# Patient Record
Sex: Male | Born: 1980 | Race: White | Hispanic: No | Marital: Single | Smoking: Never smoker
Health system: Southern US, Community
[De-identification: ages and names within clinical notes are randomized; demographics above are authoritative.]

## PROBLEM LIST (undated history)

## (undated) DIAGNOSIS — Z789 Other specified health status: Secondary | ICD-10-CM

## (undated) HISTORY — PX: FRACTURE SURGERY: SHX138

---

## 2016-02-11 ENCOUNTER — Emergency Department (HOSPITAL_BASED_OUTPATIENT_CLINIC_OR_DEPARTMENT_OTHER): Payer: Worker's Compensation

## 2016-02-11 ENCOUNTER — Emergency Department (HOSPITAL_BASED_OUTPATIENT_CLINIC_OR_DEPARTMENT_OTHER)
Admission: EM | Admit: 2016-02-11 | Discharge: 2016-02-11 | Disposition: A | Discharge status: Home / Self Care | Payer: Worker's Compensation | Attending: Emergency Medicine | Admitting: Emergency Medicine

## 2016-02-11 ENCOUNTER — Encounter (HOSPITAL_BASED_OUTPATIENT_CLINIC_OR_DEPARTMENT_OTHER): Payer: Self-pay | Admitting: *Deleted

## 2016-02-11 DIAGNOSIS — S8251XA Displaced fracture of medial malleolus of right tibia, initial encounter for closed fracture: Secondary | ICD-10-CM | POA: Diagnosis not present

## 2016-02-11 DIAGNOSIS — Y939 Activity, unspecified: Secondary | ICD-10-CM | POA: Insufficient documentation

## 2016-02-11 DIAGNOSIS — Y929 Unspecified place or not applicable: Secondary | ICD-10-CM | POA: Insufficient documentation

## 2016-02-11 DIAGNOSIS — S82891A Other fracture of right lower leg, initial encounter for closed fracture: Secondary | ICD-10-CM

## 2016-02-11 DIAGNOSIS — W11XXXA Fall on and from ladder, initial encounter: Secondary | ICD-10-CM | POA: Insufficient documentation

## 2016-02-11 DIAGNOSIS — Y999 Unspecified external cause status: Secondary | ICD-10-CM | POA: Diagnosis not present

## 2016-02-11 DIAGNOSIS — M79632 Pain in left forearm: Secondary | ICD-10-CM | POA: Insufficient documentation

## 2016-02-11 DIAGNOSIS — S99911A Unspecified injury of right ankle, initial encounter: Secondary | ICD-10-CM | POA: Diagnosis present

## 2016-02-11 MED ORDER — IBUPROFEN 600 MG PO TABS: 600.0000 mg | ORAL_TABLET | Freq: Four times a day (QID) | ORAL | 0 refills | Status: DC | PRN

## 2016-02-11 MED ORDER — OXYCODONE-ACETAMINOPHEN 5-325 MG PO TABS: 1.0000 | ORAL_TABLET | ORAL | 0 refills | Status: DC | PRN

## 2016-02-11 MED ORDER — ONDANSETRON 8 MG PO TBDP
8.0000 mg | ORAL_TABLET | Freq: Once | ORAL | Status: AC
  Administered 2016-02-11: 8 mg via ORAL
  Filled 2016-02-11: qty 1

## 2016-02-11 MED ORDER — OXYCODONE-ACETAMINOPHEN 5-325 MG PO TABS
1.0000 | ORAL_TABLET | Freq: Once | ORAL | Status: AC
  Administered 2016-02-11: 1 via ORAL
  Filled 2016-02-11: qty 1

## 2016-02-11 NOTE — ED Triage Notes (Signed)
Pt o right ankle injury x 20 mins ago

## 2016-02-11 NOTE — ED Provider Notes (Signed)
[facility]   CSN: [id number] Arrival date & time: 02/11/16  1637  By signing my name below, I, Rosario AdieWilliam Andrew Hiatt, attest that this documentation has been prepared under the direction and in the presence of Kym Fenter, PA-C.  Electronically Signed: Rosario AdieWilliam Andrew Hiatt, ED Scribe. 02/11/16. 5:47 PM.  History   Chief Complaint Chief Complaint  Patient presents with  . Ankle Injury    right   The history is provided by the patient. No language interpreter was used.    HPI Comments: Shane Montes is a 35 y.o. male who presents to the Emergency Department complaining of sudden onset, gradually worsening right ankle pain w/ associated swelling onset s/p injury which occured approximately 1 hour ago. Pt reports that he was on a ladder today approximately ~7-8 feet above the ground, when the ladder fell and he braced his fall with his right foot. No LOC or head injury. He notes associated mild left forearm pain since the incident as well. Pt has not been ambulatory since the incident secondary to pain, and his pain is exacerbated with attempted movement of the ankle/toes. No noted treatments were tried prior to coming into the ED. He denies back pain, numbness, weakness, or any other associated symptoms.   History reviewed. No pertinent past medical history.  There are no active problems to display for this patient.  History reviewed. No pertinent surgical history.  Home Medications    Prior to Admission medications   Not on File    Family History History reviewed. No pertinent family history.  Social History Social History  Substance Use Topics  . Smoking status: Never Smoker  . Smokeless tobacco: Never Used  . Alcohol use Not on file   Allergies   Patient has no known allergies.  Review of Systems Review of Systems  Musculoskeletal: Positive for arthralgias (right ankle), joint swelling (right ankle) and myalgias. Negative for  back pain.  Neurological: Negative for syncope, weakness and numbness.   Physical Exam Updated Vital Signs BP 130/90   Pulse 86   Temp 98 F (36.7 C)   Resp 18   Ht 5\' 11"  (1.803 m)   Wt 200 lb (90.7 kg)   SpO2 100%   BMI 27.89 kg/m   Physical Exam  Constitutional: He appears well-developed and well-nourished. No distress.  HENT:  Head: Normocephalic and atraumatic.  Eyes: Conjunctivae are normal.  Neck: Normal range of motion.  Cardiovascular: Normal rate.   Pulmonary/Chest: Effort normal.  Abdominal: He exhibits no distension.  Musculoskeletal: Normal range of motion.  Significant swelling over right lateral malleolus. Normal medial malleolus. TTP over lateral and medial malleoli. DP pulses intact. Cap refill <2 sec to the toes. Normal sensation over the toes. Pain with any ROM at the ankle. Normal foot.  Normal knee.   Neurological: He is alert.  Skin: No pallor.  Psychiatric: He has a normal mood and affect. His behavior is normal.  Nursing note and vitals reviewed.  ED Treatments / Results  DIAGNOSTIC STUDIES: Oxygen Saturation is 100% on RA, normal by my interpretation.   COORDINATION OF CARE: 5:47 PM-Discussed next steps with pt. Pt verbalized understanding and is agreeable with the plan.   Radiology No results found.  Procedures Procedures   Medications Ordered in ED Medications - No data to display  Initial Impression / Assessment and Plan / ED Course  I have reviewed the triage vital signs and the nursing notes.  Pertinent labs & imaging results that  were available during my care of the patient were reviewed by me and considered in my medical decision making (see chart for details).  Clinical Course    Patient emergency department with right ankle injury. No other injuries during the fall. X-ray showed comminuted fracture involving medial, posterior malleoli, lateral articular surface of tibiaotalar joint. Discussed this patient with Dr. Ophelia CharterYates with  orthopedics, who recommended splints, CT of the ankle, follow-up with him in the office. Patient was giving her Percocet in emergency department. He is neurovascularly intact. He was given crutches. Instructed to keep ankle elevated above heart level, home with Percocet and ibuprofen.  Vitals:   02/11/16 1642 02/11/16 1644 02/11/16 1911  BP:  130/90 121/73  Pulse:  86 98  Resp:  18 18  Temp:  98 F (36.7 C)   SpO2:  100% 99%  Weight: 90.7 kg    Height: 5\' 11"  (1.803 m)       Final Clinical Impressions(s) / ED Diagnoses   Final diagnoses:  Closed fracture of right ankle, initial encounter   New Prescriptions Discharge Medication List as of 02/11/2016  7:12 PM    START taking these medications   Details  ibuprofen (ADVIL,MOTRIN) 600 MG tablet Take 1 tablet (600 mg total) by mouth every 6 (six) hours as needed., Starting Wed 02/11/2016, Print    oxyCODONE-acetaminophen (PERCOCET) 5-325 MG tablet Take 1 tablet by mouth every 4 (four) hours as needed for severe pain., Starting Wed 02/11/2016, Print       I personally performed the services described in this documentation, which was scribed in my presence. The recorded information has been reviewed and is accurate.     Jaynie Crumbleatyana Orlanda Lemmerman, PA-C 02/12/16 [id number]    Maia PlanJoshua G Long, MD 02/12/16 1136

## 2016-02-11 NOTE — Discharge Instructions (Signed)
It is imperative that you keep your ankle elevated above your heart level. Ice your ankle several times a day. This type of fracture does require surgery. Please call and follow up with Dr. Ophelia CharterYates in the office. Take ibuprofen for pain. Percocet for severe pain. Return if any numbness or increased pain to the foot or toes.

## 2016-02-13 ENCOUNTER — Encounter (INDEPENDENT_AMBULATORY_CARE_PROVIDER_SITE_OTHER): Payer: Self-pay | Admitting: Family

## 2016-02-13 ENCOUNTER — Other Ambulatory Visit (INDEPENDENT_AMBULATORY_CARE_PROVIDER_SITE_OTHER): Payer: Self-pay | Admitting: Family

## 2016-02-13 ENCOUNTER — Ambulatory Visit (INDEPENDENT_AMBULATORY_CARE_PROVIDER_SITE_OTHER): Payer: Worker's Compensation | Admitting: Family

## 2016-02-13 VITALS — BP 122/77 | HR 92 | Ht 72.0 in | Wt 200.0 lb

## 2016-02-13 DIAGNOSIS — S82891A Other fracture of right lower leg, initial encounter for closed fracture: Secondary | ICD-10-CM | POA: Diagnosis not present

## 2016-02-13 NOTE — Progress Notes (Deleted)
   Office Visit Note   Patient: Shane Montes           Date of Birth: 07/22/1980           MRN: [id number] Visit Date: 02/13/2016              Requested by: No referring provider defined for this encounter. PCP: No primary care provider on file.   Assessment & Plan: Visit Diagnoses:  1. Ankle fracture, right, closed, initial encounter     Plan:  Follow-Up Instructions: No Follow-up on file.   Orders:  No orders of the defined types were placed in this encounter.  No orders of the defined types were placed in this encounter.     Procedures: No procedures performed   Clinical Data: No additional findings.   Subjective: Chief Complaint  Patient presents with  . Right Ankle - Fracture    Patient presents with right ankle fracture. He is a [employer]nsic [employer] patient who fell 8 feet off of a ladder. He was seen at [facility] with x-rays and a CT scan. He is in a wheelchair and splint today.  He is taking Oxycodone, 1 every 4 hours for pain.    Review of Systems   Objective: Vital Signs: There were no vitals taken for this visit.  Physical Exam  Ortho Exam  Specialty Comments:  No specialty comments available.  Imaging: No results found.   PMFS History: There are no active problems to display for this patient.  No past medical history on file.  No family history on file.  No past surgical history on file. Social History   Occupational History  . Not on file.   Social History Main Topics  . Smoking status: Never Smoker  . Smokeless tobacco: Never Used  . Alcohol use Not on file  . Drug use: Unknown  . Sexual activity: Not on file

## 2016-02-13 NOTE — Progress Notes (Signed)
   Office Visit Note   Patient: Shane Montes           Date of Birth: 06/21/1980           MRN: [id number] Visit Date: 02/13/2016              Requested by: No referring provider defined for this encounter. PCP: No primary care provider on file.   Assessment & Plan: Visit Diagnoses:  1. Ankle fracture, right, closed, initial encounter     Plan: We will plan for open reduction internal fixation of the tibial pilon fracture on Monday with Dr. Ophelia CharterYates. Continue with elevation. leave splint in place. Percocet and ibuprofen for pain.  Follow-Up Instructions: Return in about 1 week (around 02/20/2016), or post op.   Orders:  No orders of the defined types were placed in this encounter.  No orders of the defined types were placed in this encounter.     Procedures: No procedures performed   Clinical Data: No additional findings.   Subjective: Chief Complaint  Patient presents with  . Right Ankle - Fracture    Shane Saranimothy Que is a 35 y.o. male who presents for evaluation of an ankle fracture. He fell on 02/11/16 from a ladder while at work approximately ~7-8 feet above the ground. He is in a splint today as [facility] by [facility]. He is taking Oxycodone, 1 every 4 hours for pain.This is providing good relief. He is in a wheelchair and splint today.    This is a workman's comp case.    Review of Systems  Constitutional: Negative for chills and fever.  Musculoskeletal: Positive for arthralgias and joint swelling.     Objective: Vital Signs: BP 122/77   Pulse 92   Ht 6' (1.829 m)   Wt 200 lb (90.7 kg)   BMI 27.12 kg/m   Physical Exam  Constitutional: He is oriented to person, place, and time. He appears well-developed and well-nourished.  Pulmonary/Chest: Effort normal.  Musculoskeletal:       Right ankle: He exhibits swelling.  Cap refill less than 2.  Neurological: He is alert and oriented to person, place, and time.   Skin: Skin is warm and dry.  Psychiatric: He has a normal mood and affect.  Nursing note reviewed.   Ortho Exam  Specialty Comments:  No specialty comments available.  Imaging: No results found. Radiographs as well as CT scan from the ED were independently reviewed by Dr. Ophelia CharterYates revealing the tibial pilon fracture on the right. As well as an avulsion fracture to the tip of the lateral malleolus.  PMFS History: There are no active problems to display for this patient.  History reviewed. No pertinent past medical history.  History reviewed. No pertinent family history.  History reviewed. No pertinent surgical history. Social History   Occupational History  . Not on file.   Social History Main Topics  . Smoking status: Never Smoker  . Smokeless tobacco: Never Used  . Alcohol use Yes     Comment: occasionally  . Drug use: Unknown  . Sexual activity: Not on file

## 2016-02-13 NOTE — Progress Notes (Signed)
Pt made aware to stop taking Aspirin, vitamins, fish oil and herbal medications. Do not take any NSAIDs ie: Ibuprofen, Advil, Naproxen, BC and Goody Powderor any medication containing Aspirin. Pt verbalized understanding of all pre-op instructions. 

## 2016-02-15 MED ORDER — CEFAZOLIN SODIUM-DEXTROSE 2-4 GM/100ML-% IV SOLN
2.0000 g | INTRAVENOUS | Status: AC
  Administered 2016-02-16: 2 g via INTRAVENOUS
  Filled 2016-02-15: qty 100

## 2016-02-16 ENCOUNTER — Inpatient Hospital Stay (HOSPITAL_COMMUNITY)
Admission: RE | Admit: 2016-02-16 | Discharge: 2016-02-19 | DRG: 494 | Disposition: A | Discharge status: Home / Self Care | Payer: Worker's Compensation | Source: Ambulatory Visit | Attending: Orthopaedic Surgery | Admitting: Orthopaedic Surgery

## 2016-02-16 ENCOUNTER — Encounter (HOSPITAL_COMMUNITY): Payer: Self-pay | Admitting: *Deleted

## 2016-02-16 ENCOUNTER — Inpatient Hospital Stay (HOSPITAL_COMMUNITY): Payer: Worker's Compensation | Admitting: Anesthesiology

## 2016-02-16 ENCOUNTER — Encounter (HOSPITAL_COMMUNITY)
Admission: RE | Disposition: A | Discharge status: Home / Self Care | Payer: Self-pay | Source: Ambulatory Visit | Attending: Orthopaedic Surgery

## 2016-02-16 ENCOUNTER — Inpatient Hospital Stay (HOSPITAL_COMMUNITY): Payer: Worker's Compensation

## 2016-02-16 DIAGNOSIS — S82871A Displaced pilon fracture of right tibia, initial encounter for closed fracture: Secondary | ICD-10-CM | POA: Diagnosis present

## 2016-02-16 DIAGNOSIS — M2100 Valgus deformity, not elsewhere classified, unspecified site: Secondary | ICD-10-CM | POA: Diagnosis present

## 2016-02-16 DIAGNOSIS — W11XXXA Fall on and from ladder, initial encounter: Secondary | ICD-10-CM | POA: Diagnosis present

## 2016-02-16 DIAGNOSIS — S82871D Displaced pilon fracture of right tibia, subsequent encounter for closed fracture with routine healing: Secondary | ICD-10-CM | POA: Diagnosis present

## 2016-02-16 DIAGNOSIS — Z419 Encounter for procedure for purposes other than remedying health state, unspecified: Secondary | ICD-10-CM

## 2016-02-16 HISTORY — PX: ORIF ANKLE FRACTURE: SUR919

## 2016-02-16 HISTORY — PX: EXTERNAL FIXATION LEG: SHX1549

## 2016-02-16 HISTORY — DX: Other specified health status: Z78.9

## 2016-02-16 HISTORY — PX: ORIF ANKLE FRACTURE: SHX5408

## 2016-02-16 LAB — CBC WITH DIFFERENTIAL/PLATELET
BASOS PCT: 0 %
Basophils Absolute: 0 10*3/uL (ref 0.0–0.1)
EOS ABS: 0.2 10*3/uL (ref 0.0–0.7)
EOS PCT: 3 %
HCT: 41.3 % (ref 39.0–52.0)
HEMOGLOBIN: 14.4 g/dL (ref 13.0–17.0)
LYMPHS ABS: 0.8 10*3/uL (ref 0.7–4.0)
Lymphocytes Relative: 10 %
MCH: 30.1 pg (ref 26.0–34.0)
MCHC: 34.9 g/dL (ref 30.0–36.0)
MCV: 86.4 fL (ref 78.0–100.0)
Monocytes Absolute: 0.8 10*3/uL (ref 0.1–1.0)
Monocytes Relative: 11 %
NEUTROS PCT: 76 %
Neutro Abs: 5.8 10*3/uL (ref 1.7–7.7)
PLATELETS: 220 10*3/uL (ref 150–400)
RBC: 4.78 MIL/uL (ref 4.22–5.81)
RDW: 12.1 % (ref 11.5–15.5)
WBC: 7.7 10*3/uL (ref 4.0–10.5)

## 2016-02-16 LAB — COMPREHENSIVE METABOLIC PANEL
ALBUMIN: 4.1 g/dL (ref 3.5–5.0)
ALK PHOS: 46 U/L (ref 38–126)
ALT: 21 U/L (ref 17–63)
ANION GAP: 10 (ref 5–15)
AST: 24 U/L (ref 15–41)
BUN: 10 mg/dL (ref 6–20)
CALCIUM: 9.3 mg/dL (ref 8.9–10.3)
CHLORIDE: 104 mmol/L (ref 101–111)
CO2: 25 mmol/L (ref 22–32)
Creatinine, Ser: 0.89 mg/dL (ref 0.61–1.24)
GFR calc non Af Amer: >60 mL/min (ref >60)
GLUCOSE: 103 mg/dL — AB (ref 65–99)
Potassium: 3.7 mmol/L (ref 3.5–5.1)
SODIUM: 139 mmol/L (ref 135–145)
TOTAL PROTEIN: 7.8 g/dL (ref 6.5–8.1)
Total Bilirubin: 1.2 mg/dL (ref 0.3–1.2)

## 2016-02-16 LAB — NO BLOOD PRODUCTS

## 2016-02-16 SURGERY — OPEN REDUCTION INTERNAL FIXATION (ORIF) ANKLE FRACTURE
Anesthesia: Regional | Site: Ankle | Laterality: Right

## 2016-02-16 MED ORDER — DOCUSATE SODIUM 100 MG PO CAPS
100.0000 mg | ORAL_CAPSULE | Freq: Two times a day (BID) | ORAL | Status: DC
  Administered 2016-02-16 – 2016-02-19 (×6): 100 mg via ORAL
  Filled 2016-02-16 (×6): qty 1

## 2016-02-16 MED ORDER — LIDOCAINE HCL (CARDIAC) 20 MG/ML IV SOLN
INTRAVENOUS | Status: DC | PRN
  Administered 2016-02-16: 100 mg via INTRAVENOUS

## 2016-02-16 MED ORDER — ACETAMINOPHEN 325 MG PO TABS
650.0000 mg | ORAL_TABLET | Freq: Four times a day (QID) | ORAL | Status: DC | PRN
  Administered 2016-02-18 – 2016-02-19 (×3): 650 mg via ORAL
  Filled 2016-02-16 (×3): qty 2

## 2016-02-16 MED ORDER — ONDANSETRON HCL 4 MG/2ML IJ SOLN: 4.0000 mg | Freq: Four times a day (QID) | INTRAMUSCULAR | Status: DC | PRN

## 2016-02-16 MED ORDER — CHLORHEXIDINE GLUCONATE 4 % EX LIQD: 60.0000 mL | Freq: Once | CUTANEOUS | Status: DC

## 2016-02-16 MED ORDER — 0.9 % SODIUM CHLORIDE (POUR BTL) OPTIME
TOPICAL | Status: DC | PRN
  Administered 2016-02-16: 1000 mL

## 2016-02-16 MED ORDER — PROPOFOL 10 MG/ML IV BOLUS
INTRAVENOUS | Status: AC
  Filled 2016-02-16: qty 40

## 2016-02-16 MED ORDER — OXYCODONE HCL 5 MG/5ML PO SOLN: 5.0000 mg | Freq: Once | ORAL | Status: DC | PRN

## 2016-02-16 MED ORDER — MIDAZOLAM HCL 2 MG/2ML IJ SOLN
2.0000 mg | Freq: Once | INTRAMUSCULAR | Status: AC
  Administered 2016-02-16: 2 mg via INTRAVENOUS

## 2016-02-16 MED ORDER — FENTANYL CITRATE (PF) 100 MCG/2ML IJ SOLN
INTRAMUSCULAR | Status: AC
  Administered 2016-02-16: 100 ug via INTRAVENOUS
  Filled 2016-02-16: qty 2

## 2016-02-16 MED ORDER — OXYCODONE-ACETAMINOPHEN 7.5-325 MG PO TABS
1.0000 | ORAL_TABLET | Freq: Four times a day (QID) | ORAL | Status: DC | PRN
  Administered 2016-02-17 (×3): 2 via ORAL
  Administered 2016-02-17: 1 via ORAL
  Administered 2016-02-18 – 2016-02-19 (×5): 2 via ORAL
  Filled 2016-02-16 (×5): qty 2
  Filled 2016-02-16: qty 1
  Filled 2016-02-16 (×4): qty 2

## 2016-02-16 MED ORDER — PROPOFOL 10 MG/ML IV BOLUS
INTRAVENOUS | Status: DC | PRN
  Administered 2016-02-16: 180 mg via INTRAVENOUS

## 2016-02-16 MED ORDER — HYDROMORPHONE HCL 2 MG/ML IJ SOLN
1.0000 mg | INTRAMUSCULAR | Status: DC | PRN
  Administered 2016-02-17 – 2016-02-19 (×5): 1 mg via INTRAVENOUS
  Filled 2016-02-16 (×5): qty 1

## 2016-02-16 MED ORDER — FENTANYL CITRATE (PF) 100 MCG/2ML IJ SOLN
INTRAMUSCULAR | Status: AC
  Filled 2016-02-16: qty 2

## 2016-02-16 MED ORDER — MIDAZOLAM HCL 2 MG/2ML IJ SOLN
INTRAMUSCULAR | Status: AC
  Administered 2016-02-16: 2 mg via INTRAVENOUS
  Filled 2016-02-16: qty 2

## 2016-02-16 MED ORDER — LACTATED RINGERS IV SOLN
INTRAVENOUS | Status: DC | PRN
  Administered 2016-02-16 (×2): via INTRAVENOUS

## 2016-02-16 MED ORDER — SODIUM CHLORIDE 0.9 % IV SOLN
INTRAVENOUS | Status: DC
  Administered 2016-02-16: 22:00:00 via INTRAVENOUS

## 2016-02-16 MED ORDER — MIDAZOLAM HCL 2 MG/2ML IJ SOLN
INTRAMUSCULAR | Status: AC
  Filled 2016-02-16: qty 2

## 2016-02-16 MED ORDER — ONDANSETRON HCL 4 MG/2ML IJ SOLN
INTRAMUSCULAR | Status: DC | PRN
  Administered 2016-02-16: 4 mg via INTRAVENOUS

## 2016-02-16 MED ORDER — ROPIVACAINE HCL 7.5 MG/ML IJ SOLN
INTRAMUSCULAR | Status: DC | PRN
  Administered 2016-02-16: 5 mL via PERINEURAL
  Administered 2016-02-16: 10 mL via PERINEURAL

## 2016-02-16 MED ORDER — CEFAZOLIN IN D5W 1 GM/50ML IV SOLN
1.0000 g | Freq: Three times a day (TID) | INTRAVENOUS | Status: AC
  Administered 2016-02-16 – 2016-02-17 (×3): 1 g via INTRAVENOUS
  Filled 2016-02-16 (×3): qty 50

## 2016-02-16 MED ORDER — METOCLOPRAMIDE HCL 5 MG PO TABS: 5.0000 mg | ORAL_TABLET | Freq: Three times a day (TID) | ORAL | Status: DC | PRN

## 2016-02-16 MED ORDER — METHOCARBAMOL 500 MG PO TABS
500.0000 mg | ORAL_TABLET | Freq: Four times a day (QID) | ORAL | Status: DC | PRN
  Administered 2016-02-17 – 2016-02-19 (×8): 500 mg via ORAL
  Filled 2016-02-16 (×9): qty 1

## 2016-02-16 MED ORDER — FENTANYL CITRATE (PF) 100 MCG/2ML IJ SOLN
100.0000 ug | Freq: Once | INTRAMUSCULAR | Status: AC
  Administered 2016-02-16: 100 ug via INTRAVENOUS

## 2016-02-16 MED ORDER — PHENYLEPHRINE HCL 10 MG/ML IJ SOLN
INTRAMUSCULAR | Status: DC | PRN
Start: 2016-02-16 — End: 2016-02-16
  Administered 2016-02-16: 80 ug via INTRAVENOUS

## 2016-02-16 MED ORDER — MIDAZOLAM HCL 5 MG/5ML IJ SOLN
INTRAMUSCULAR | Status: DC | PRN
  Administered 2016-02-16: 0.5 mg via INTRAVENOUS

## 2016-02-16 MED ORDER — METHOCARBAMOL 1000 MG/10ML IJ SOLN
500.0000 mg | Freq: Four times a day (QID) | INTRAVENOUS | Status: DC | PRN
  Filled 2016-02-16: qty 5

## 2016-02-16 MED ORDER — HYDROMORPHONE HCL 1 MG/ML IJ SOLN: 0.2500 mg | INTRAMUSCULAR | Status: DC | PRN

## 2016-02-16 MED ORDER — FENTANYL CITRATE (PF) 100 MCG/2ML IJ SOLN
INTRAMUSCULAR | Status: DC | PRN
  Administered 2016-02-16: 50 ug via INTRAVENOUS
  Administered 2016-02-16 (×2): 25 ug via INTRAVENOUS

## 2016-02-16 MED ORDER — BUPIVACAINE-EPINEPHRINE (PF) 0.5% -1:200000 IJ SOLN
INTRAMUSCULAR | Status: DC | PRN
  Administered 2016-02-16: 20 mL via PERINEURAL
  Administered 2016-02-16: 10 mL via PERINEURAL

## 2016-02-16 MED ORDER — ONDANSETRON HCL 4 MG PO TABS: 4.0000 mg | ORAL_TABLET | Freq: Four times a day (QID) | ORAL | Status: DC | PRN

## 2016-02-16 MED ORDER — OXYCODONE HCL 5 MG PO TABS: 5.0000 mg | ORAL_TABLET | Freq: Once | ORAL | Status: DC | PRN

## 2016-02-16 MED ORDER — POLYETHYLENE GLYCOL 3350 17 G PO PACK
17.0000 g | PACK | Freq: Every day | ORAL | Status: DC | PRN
  Filled 2016-02-16: qty 1

## 2016-02-16 MED ORDER — METOCLOPRAMIDE HCL 5 MG/ML IJ SOLN: 5.0000 mg | Freq: Three times a day (TID) | INTRAMUSCULAR | Status: DC | PRN

## 2016-02-16 MED ORDER — ACETAMINOPHEN 650 MG RE SUPP: 650.0000 mg | Freq: Four times a day (QID) | RECTAL | Status: DC | PRN

## 2016-02-16 SURGICAL SUPPLY — 74 items
BANDAGE ACE 4X5 VEL STRL LF (GAUZE/BANDAGES/DRESSINGS) ×6 IMPLANT
BANDAGE ACE 6X5 VEL STRL LF (GAUZE/BANDAGES/DRESSINGS) IMPLANT
BANDAGE ESMARK 6X9 LF (GAUZE/BANDAGES/DRESSINGS) ×1 IMPLANT
BAR GLASS FIBER EXFX 11X400 (EXFIX) ×6 IMPLANT
BIT DRILL 2.5X2.75 QC CALB (BIT) ×3 IMPLANT
BIT DRILL 2.9 CANN QC NONSTRL (BIT) ×3 IMPLANT
BIT DRILL 3.5X5.5 QC CALB (BIT) ×3 IMPLANT
BNDG ESMARK 6X9 LF (GAUZE/BANDAGES/DRESSINGS) ×3
BNDG GAUZE ELAST 4 BULKY (GAUZE/BANDAGES/DRESSINGS) ×6 IMPLANT
CATH ROBINSON RED A/P 14FR (CATHETERS) ×3 IMPLANT
CLAMP BLUE BAR TO PIN (EXFIX) ×6 IMPLANT
COVER MAYO STAND STRL (DRAPES) ×3 IMPLANT
COVER SURGICAL LIGHT HANDLE (MISCELLANEOUS) ×3 IMPLANT
CUFF TOURNIQUET SINGLE 34IN LL (TOURNIQUET CUFF) ×3 IMPLANT
CUFF TOURNIQUET SINGLE 44IN (TOURNIQUET CUFF) IMPLANT
DRAPE C-ARM 42X72 X-RAY (DRAPES) ×3 IMPLANT
DRAPE C-ARMOR (DRAPES) ×3 IMPLANT
DRAPE INCISE IOBAN 66X45 STRL (DRAPES) IMPLANT
DRAPE PROXIMA HALF (DRAPES) ×3 IMPLANT
DRAPE U-SHAPE 47X51 STRL (DRAPES) ×3 IMPLANT
DRSG PAD ABDOMINAL 8X10 ST (GAUZE/BANDAGES/DRESSINGS) ×9 IMPLANT
DURAPREP 26ML APPLICATOR (WOUND CARE) ×3 IMPLANT
ELECT REM PT RETURN 9FT ADLT (ELECTROSURGICAL) ×3
ELECTRODE REM PT RTRN 9FT ADLT (ELECTROSURGICAL) ×1 IMPLANT
GAUZE SPONGE 4X4 12PLY STRL (GAUZE/BANDAGES/DRESSINGS) ×3 IMPLANT
GAUZE XEROFORM 5X9 LF (GAUZE/BANDAGES/DRESSINGS) ×3 IMPLANT
GLOVE BIOGEL PI IND STRL 8 (GLOVE) ×2 IMPLANT
GLOVE BIOGEL PI INDICATOR 8 (GLOVE) ×4
GLOVE ORTHO TXT STRL SZ7.5 (GLOVE) ×6 IMPLANT
GOWN STRL REUS W/ TWL LRG LVL3 (GOWN DISPOSABLE) ×1 IMPLANT
GOWN STRL REUS W/ TWL XL LVL3 (GOWN DISPOSABLE) ×1 IMPLANT
GOWN STRL REUS W/TWL 2XL LVL3 (GOWN DISPOSABLE) ×3 IMPLANT
GOWN STRL REUS W/TWL LRG LVL3 (GOWN DISPOSABLE) ×2
GOWN STRL REUS W/TWL XL LVL3 (GOWN DISPOSABLE) ×2
K-WIRE ACE 1.6X6 (WIRE) ×9
KIT BASIN OR (CUSTOM PROCEDURE TRAY) ×3 IMPLANT
KIT ROOM TURNOVER OR (KITS) ×3 IMPLANT
KWIRE ACE 1.6X6 (WIRE) ×3 IMPLANT
MANIFOLD NEPTUNE II (INSTRUMENTS) ×3 IMPLANT
NS IRRIG 1000ML POUR BTL (IV SOLUTION) ×3 IMPLANT
PACK ORTHO EXTREMITY (CUSTOM PROCEDURE TRAY) ×3 IMPLANT
PAD ARMBOARD 7.5X6 YLW CONV (MISCELLANEOUS) ×6 IMPLANT
PAD CAST 4YDX4 CTTN HI CHSV (CAST SUPPLIES) ×2 IMPLANT
PADDING CAST COTTON 4X4 STRL (CAST SUPPLIES) ×4
PADDING CAST COTTON 6X4 STRL (CAST SUPPLIES) ×3 IMPLANT
PIN CLAMP 2BAR 75MM BLUE (EXFIX) ×3 IMPLANT
PIN HALF YELLOW 5X160X35 (EXFIX) ×6 IMPLANT
PIN TRANSFIXING 5.0 (EXFIX) ×3 IMPLANT
PLATE 6H RT DIST ANTLAT TIB (Plate) ×2 IMPLANT
PLATE ANTLAT CNTR NAR 114X6 (Plate) ×1 IMPLANT
SCREW 3.5MM CORT LP 34MM (Screw) ×3 IMPLANT
SCREW ACE CAN 4.0 40M (Screw) ×6 IMPLANT
SCREW CORT T15 30X3.5XST LCK (Screw) ×1 IMPLANT
SCREW CORTICAL 3.5MM  30MM (Screw) ×2 IMPLANT
SCREW CORTICAL 3.5MM 30MM (Screw) ×1 IMPLANT
SCREW CORTICAL 3.5MM 40MM (Screw) ×3 IMPLANT
SCREW CORTICAL 3.5MM 48MM (Screw) ×6 IMPLANT
SCREW CORTICAL 3.5X30MM (Screw) ×2 IMPLANT
SCREW LOCK CORT STAR 3.5X48 (Screw) ×3 IMPLANT
SCREW LP 3.5 (Screw) ×3 IMPLANT
SPONGE LAP 18X18 X RAY DECT (DISPOSABLE) ×3 IMPLANT
STAPLER VISISTAT 35W (STAPLE) IMPLANT
SUCTION FRAZIER HANDLE 10FR (MISCELLANEOUS) ×2
SUCTION TUBE FRAZIER 10FR DISP (MISCELLANEOUS) ×1 IMPLANT
SUT ETHILON 2 0 PSLX (SUTURE) ×6 IMPLANT
SUT ETHILON 3 0 PS 1 (SUTURE) ×6 IMPLANT
SUT VIC AB 2-0 CT1 27 (SUTURE) ×10
SUT VIC AB 2-0 CT1 TAPERPNT 27 (SUTURE) ×5 IMPLANT
TOWEL OR 17X24 6PK STRL BLUE (TOWEL DISPOSABLE) ×3 IMPLANT
TOWEL OR 17X26 10 PK STRL BLUE (TOWEL DISPOSABLE) ×3 IMPLANT
TUBE CONNECTING 12'X1/4 (SUCTIONS) ×1
TUBE CONNECTING 12X1/4 (SUCTIONS) ×2 IMPLANT
WATER STERILE IRR 1000ML POUR (IV SOLUTION) IMPLANT
YANKAUER SUCT BULB TIP NO VENT (SUCTIONS) ×3 IMPLANT

## 2016-02-16 NOTE — Interval H&P Note (Signed)
History and Physical Interval Note:  02/16/2016 2:25 PM  Shane Montes  has presented today for surgery, with the diagnosis of Right Distal Tibia Pilon Fracture  The various methods of treatment have been discussed with the patient and family. After consideration of risks, benefits and other options for treatment, the patient has consented to  Procedure(s): OPEN REDUCTION INTERNAL FIXATION (ORIF) RIGHT DISTAL TIBIA PILON FRACTURE (Right) as a surgical intervention .  The patient's history has been reviewed, patient examined, no change in status, stable for surgery.  I have reviewed the patient's chart and labs.  Questions were answered to the patient's satisfaction.     Eldred MangesMark C Rosalyn Archambault

## 2016-02-16 NOTE — Anesthesia Procedure Notes (Signed)
Anesthesia Regional Block:  Popliteal block  Pre-Anesthetic Checklist: ,, timeout performed, Correct Patient, Correct Site, Correct Laterality, Correct Procedure, Correct Position, site marked, Risks and benefits discussed,  Surgical consent,  Pre-op evaluation,  At surgeon's request and post-op pain management  Laterality: Lower and Right  Prep: chloraprep       Needles:  Injection technique: Single-shot  Needle Type: Echogenic Stimulator Needle          Additional Needles:  Procedures: ultrasound guided (picture in chart) Popliteal block Narrative:  Start time: 02/16/2016 2:45 PM End time: 02/16/2016 2:53 PM Injection made incrementally with aspirations every 5 mL.  Performed by: Personally  Anesthesiologist: Jacqulyne Gladue  Additional Notes: H+P and labs reviewed, risks and benefits discussed with patient, procedure tolerated well without complications

## 2016-02-16 NOTE — Anesthesia Preprocedure Evaluation (Addendum)
Anesthesia Evaluation  Patient identified by MRN, date of birth, ID band Patient awake    Reviewed: Allergy & Precautions, NPO status , Patient's Chart, lab work & pertinent test results  History of Anesthesia Complications Negative for: history of anesthetic complications  Airway Mallampati: I  TM Distance: >3 FB Neck ROM: Full    Dental  (+) Teeth Intact, Dental Advisory Given   Pulmonary neg pulmonary ROS,    breath sounds clear to auscultation       Cardiovascular negative cardio ROS   Rhythm:Regular     Neuro/Psych negative neurological ROS  negative psych ROS   GI/Hepatic negative GI ROS, Neg liver ROS,   Endo/Other  negative endocrine ROS  Renal/GU negative Renal ROS     Musculoskeletal   Abdominal   Peds  Hematology negative hematology ROS (+)   Anesthesia Other Findings Je[employer]  Reproductive/Obstetrics                            Anesthesia Physical Anesthesia Plan  ASA: I  Anesthesia Plan: General and Regional   Post-op Pain Management:    Induction: Intravenous  Airway Management Planned: LMA and Oral ETT  Additional Equipment: None  Intra-op Plan:   Post-operative Plan: Extubation in OR  Informed Consent: I have reviewed the patients History and Physical, chart, labs and discussed the procedure including the risks, benefits and alternatives for the proposed anesthesia with the patient or authorized representative who has indicated his/her understanding and acceptance.   Dental advisory given  Plan Discussed with: CRNA and Surgeon  Anesthesia Plan Comments:        Anesthesia Quick Evaluation

## 2016-02-16 NOTE — Anesthesia Procedure Notes (Signed)
Procedure Name: LMA Insertion Date/Time: 02/16/2016 3:38 PM Performed by: Darcey NoraJAMES, Jamella Grayer B Pre-anesthesia Checklist: Patient identified, Emergency Drugs available, Suction available and Patient being monitored Patient Re-evaluated:Patient Re-evaluated prior to inductionOxygen Delivery Method: Circle system utilized Preoxygenation: Pre-oxygenation with 100% oxygen Intubation Type: IV induction Ventilation: Mask ventilation without difficulty LMA: LMA inserted LMA Size: 5.0 Number of attempts: 1 Placement Confirmation: positive ETCO2 and breath sounds checked- equal and bilateral Tube secured with: Tape (taped across cheeks) Dental Injury: Teeth and Oropharynx as per pre-operative assessment

## 2016-02-16 NOTE — Transfer of Care (Signed)
Immediate Anesthesia Transfer of Care Note  Patient: Shane Montes  Procedure(s) Performed: Procedure(s): OPEN REDUCTION INTERNAL FIXATION (ORIF) RIGHT DISTAL TIBIA PILON FRACTURE (Right) EXTERNAL FIXATION RIGHT DISTAL TIBIA FOR REDUCTION AND LENGTH ACQUSITION OF PILON FRACTURE FOR PLATE FIXATAION OF FRACTURE (Right)  Patient Location: PACU  Anesthesia Type:GA combined with regional for post-op pain  Level of Consciousness: awake, oriented and patient cooperative  Airway & Oxygen Therapy: Patient Spontanous Breathing and Patient connected to nasal cannula oxygen  Post-op Assessment: Report given to RN, Post -op Vital signs reviewed and stable and Patient moving all extremities  Post vital signs: Reviewed and stable  Last Vitals:  Vitals:   02/16/16 1500 02/16/16 1501  BP:    Pulse: 90 97  Resp:    Temp:      Last Pain:  Vitals:   02/16/16 1258  TempSrc: Oral         Complications: No apparent anesthesia complications

## 2016-02-16 NOTE — Anesthesia Procedure Notes (Signed)
Anesthesia Regional Block:  Adductor canal block  Pre-Anesthetic Checklist: ,, timeout performed, Correct Patient, Correct Site, Correct Laterality, Correct Procedure, Correct Position, site marked, Risks and benefits discussed,  Surgical consent,  Pre-op evaluation,  At surgeon's request and post-op pain management  Laterality: Lower and Right  Prep: chloraprep       Needles:  Injection technique: Single-shot  Needle Type: Echogenic Stimulator Needle          Additional Needles:  Procedures: ultrasound guided (picture in chart) Adductor canal block Narrative:  Start time: 02/16/2016 2:45 PM End time: 02/16/2016 2:53 PM Injection made incrementally with aspirations every 5 mL.  Performed by: Personally  Anesthesiologist: Margrete Delude  Additional Notes: H+P and labs reviewed, risks and benefits discussed with patient, procedure tolerated well without complications

## 2016-02-16 NOTE — H&P (Signed)
 [] Hover for attribution information   Office Visit Note/  History and Physical Exam              Patient: Shane Montes                                 Date of Birth: 09/22/1980                                                     MRN: [id number] Visit Date: 02/13/2016                                                                     Requested by: No referring provider defined for this encounter. PCP: No primary care provider on file.   Assessment & Plan: Visit Diagnoses:  1. Ankle fracture, right, closed, initial encounter     Plan: We will plan for open reduction internal fixation of the tibial pilon fracture on Monday with Dr. Ophelia CharterYates. Continue with elevation. leave splint in place. Percocet and ibuprofen for pain.  Follow-Up Instructions: Return in about 1 week (around 02/20/2016), or post op.   Orders:  No orders of the defined types were placed in this encounter.  No orders of the defined types were placed in this encounter.     Procedures: No procedures performed   Clinical Data: No additional findings.   Subjective:    Chief Complaint  Patient presents with  . Right Ankle - Fracture    Shane Montes is a 35 y.o. male who presents for evaluation of an ankle fracture. He fell on 02/11/16 from a ladder while at work approximately ~7-8 feet above the ground. He is in a splint today as [facility] by [facility]. He is taking Oxycodone, 1 every 4 hours for pain.This is providing good relief. He is in a wheelchair and splint today.    This is a workman's comp case.   Review of Systems  Constitutional: Negative for chills and fever.  Musculoskeletal: Positive for arthralgias and joint swelling.     Objective: Vital Signs: BP 122/77   Pulse 92   Ht 6' (1.829 m)   Wt 200 lb (90.7 kg)   BMI 27.12 kg/m   Physical Exam  Constitutional: He is oriented to person, place, and time. He appears  well-developed and well-nourished.  Pulmonary/Chest: Effort normal.  Musculoskeletal:       Right ankle: He exhibits swelling.  Cap refill less than 2.  Neurological: He is alert and oriented to person, place, and time.  Skin: Skin is warm and dry.  Psychiatric: He has a normal mood and affect.  Nursing note reviewed.   Ortho Exam  Specialty Comments:  No specialty comments available.  Imaging: No results found. Radiographs as well as CT scan from the ED were independently reviewed by Dr. Ophelia CharterYates revealing the tibial pilon fracture on the right. As well as an avulsion fracture to the tip of the lateral malleolus.  PMFS History: There are no active problems to display  for this patient.  History reviewed. No pertinent past medical history.  History reviewed. No pertinent family history.  History reviewed. No pertinent surgical history. Social History      Occupational History  . Not on file.           Social History Main Topics    . Smoking status: Never Smoker   . Smokeless tobacco: Never Used   . Alcohol use Yes      Comment: occasionally    . Drug use: Unknown  . Sexual activity: Not on file           Electronically signed by Berton LanErin Renee Zamora, NP at 02/13/2016 4:33 PM      Office Visit on 02/13/2016        Detailed Report

## 2016-02-17 ENCOUNTER — Encounter (HOSPITAL_COMMUNITY): Payer: Self-pay | Admitting: Orthopaedic Surgery

## 2016-02-17 LAB — BASIC METABOLIC PANEL
Anion gap: 10 (ref 5–15)
BUN: 7 mg/dL (ref 6–20)
CALCIUM: 8.6 mg/dL — AB (ref 8.9–10.3)
CO2: 26 mmol/L (ref 22–32)
CREATININE: 0.89 mg/dL (ref 0.61–1.24)
Chloride: 101 mmol/L (ref 101–111)
Glucose, Bld: 120 mg/dL — ABNORMAL HIGH (ref 65–99)
Potassium: 3.8 mmol/L (ref 3.5–5.1)
SODIUM: 137 mmol/L (ref 135–145)

## 2016-02-17 NOTE — Brief Op Note (Signed)
02/16/2016  7:49 AM  PATIENT:  Joenathan Defina  35 y.o. male  PRE-OPERATIVE DIAGNOSIS:  Right Distal Tibia Pilon Fracture  POST-OPERATIVE DIAGNOSIS:  Right Distal Tibia Pilon Fracture  PROCEDURE:  Procedure(s): OPEN REDUCTION INTERNAL FIXATION (ORIF) RIGHT DISTAL TIBIA PILON FRACTURE (Right) EXTERNAL FIXATION RIGHT DISTAL TIBIA FOR REDUCTION AND LENGTH ACQUSITION OF PILON FRACTURE FOR PLATE FIXATAION OF FRACTURE (Right)  SURGEON:  Surgeon(s) and Role: Panel 1:     Eldred MangesMark C Yates, MD   PHYSICIAN ASSISTANT: Briarrose Shor pa-c    ANESTHESIA:   general  EBL:  No intake/output data recorded.  BLOOD ADMINISTERED:none  DRAINS: none   LOCAL MEDICATIONS USED:  NONE  SPECIMEN:  No Specimen  DISPOSITION OF SPECIMEN:  N/A  COUNTS:  YES  TOURNIQUET:   Total Tourniquet Time Documented: Thigh (Right) - 66 minutes Total: Thigh (Right) - 66 minutes   DICTATION: .Reubin Milanragon Dictation  PLAN OF CARE: Admit to inpatient   PATIENT DISPOSITION:  PACU - hemodynamically stable.

## 2016-02-17 NOTE — Op Note (Signed)
NAME:  Shane Montes, Shane Montes         ACCOUNT NO.:  [id number]  MEDICAL RECORD NO.:  [id number]  LOCATION:                                 FACILITY:  PHYSICIAN:  Philipp Callegari C. Ophelia CharterYates, M.D.    DATE OF BIRTH:  March 17, 1980  DATE OF PROCEDURE:  02/16/2016 DATE OF DISCHARGE:                              OPERATIVE REPORT   PREOPERATIVE DIAGNOSIS:  Right severely comminuted pilon fracture (fracture of distal tibia articular surface).  POSTOPERATIVE DIAGNOSIS:  Right severely comminuted pilon fracture (fracture of distal tibia articular surface).  PROCEDURE:  Application of external fixator, open reduction and internal fixation, anterolateral compression plate fixation.  SURGEON:  Indra Wolters C. Ophelia CharterYates, M.D.  ASSISTANT:  Genene ChurnJames M. Barry Dieneswens, PA-C, medically necessary and present for the entire procedure.  ANESTHESIA:  General.  DESCRIPTION OF PROCEDURE:  After induction of general anesthesia, preoperative Ancef prophylaxis, clipping of the hair anteriorly, there was only mild swelling.  The patient had been compliant, kept his leg elevated the last 3 days.  After time-out procedure, standard prepping and draping with DuraPrep, external fixator was applied using C-arm visualization.  Two bicortical pins were placed proximally far enough above the length of the short Biomet anterolateral plate so that they would not be close to the incision.  40-mm rods were used and transcalcaneal pin was placed and then traction was applied straightening out the valgus deformity of the distal tibia articular surface.  The patient had blown the distal tibia into multiple pieces with extreme comminution of the articular surface but the posterior malleolus was a large piece, the medial malleolus was fractured but was in relatively good position and there was posterolateral comminution. Once the fixator was taken out to length, a sterile skin marker was used to outline the planned incision and an anterolateral exposure  was performed.  The superficial peroneal vessels were mobilized.  The extensors were taken toward the midline.  Subperiosteal dissection on the anterior surface of the tibia was performed until the fracture could be reduced.  Using 2 sets of [facility] tongs, a stab incision was made posteriorly adjacent to the Achilles tendon and posterior to the peroneal tendons.  Blunt dissection was performed with a tonsil clamp down to the posterior cortex, and using combination of two [employer] tongs pulling from posterior closing the posterior malleolar fragment which had to be adjusted several times until finally it was clamped low enough to pull it in good position.  A stab incision was made anteromedial making sure that it was medial neurovascular bundle and not far enough medial to catch the saphenous vein and this was tightened down securely.  K-wires were placed, and then lag screws were used over drilling the proximal cortex 3.5 and then catching the posterior cortex. Screws were then placed from lateral to medial identically with the other tong tightened down holding the lateral cortex of the distal tibia to the medial cortex which closed down the fracture site.  This was under direct visualization and 2 screws were placed.  Anterolateral plate then had the self-retaining screw.  Guides were removed from the distal aspect.  It was just placed on the tibia, held with a K-wire just it is needed, and then  individually the purple guide screw reamers were placed back onto the plate and distal most articular surface screws were placed lagging from anterior to posterior securing the posterior malleolar fragment that had been pulled down the [facility] tongs and had been fixed with a screw from proximal anteromedial to posterolateral. AP and lateral fluoroscopy and mortise views were used for rotation and checked to make sure all screws were good.  Two 40-mm malleolar screws were then placed, one  screw was close to the joint and did not enter the joint.  Irrigation was performed and proximally 2 screws have been placed nonlocking to suck the plate down to the bone as well as distal one of the screws nonlocking to suck the plate down to the bone, four locking screws were placed.  Articular surface was in satisfactory position.  Irrigation and then closure of the retinaculum then opened with 2-0 Vicryl.  A 2-0 nylon far-near and near-far skin suture, Xeroform.  The external fixator was relaxed, checked under fluoro to make sure the joint surface still looked good.  With the ankle held in good position, the external fixator was then tightened back down securely.  Since there was severe comminution of the joint but the joint was in good position, the external fixator was left on for additional stability.  The patient will be nonweightbearing.  Xeroform and 4x4s were used for stab incisions.  For the [facility] tong application posterolateral was closed with stapler, stab incision for the anteromedial [employer] tong was closed with nylon and some with staples. Compartments were soft.  Xeroform, 4x4s, ABDs, and Ace wraps were applied.  The patient tolerated the procedure well and was transferred to recovery room in stable condition.     Lillee Mooneyhan C. Ophelia CharterYates, M.D.   ______________________________ Veverly FellsMark C. Ophelia CharterYates, M.D.    MCY/MEDQ  D:  02/16/2016  T:  02/17/2016  Job:  [id number]

## 2016-02-17 NOTE — Evaluation (Signed)
Physical Therapy Evaluation Patient Details Name: Shane Montes MRN: [id number] DOB: 11/23/1980 Today's Date: 02/17/2016   History of Present Illness  Admitted after fall from ladder resulting in R tibial pilon fx; now s/p ORIF and External Fixator placement; NWB;  has a past medical history of Medical history non-contributory.   Clinical Impression   Patient is s/p above surgery resulting in functional limitations due to the deficits listed below (see PT Problem List). Overall moving well, though painful; Good maintenance of NWB; Worked today with RW for more stability first time up post-op, but anticipate good progression to crutches;  Patient will benefit from skilled PT to increase their independence and safety with mobility to allow discharge to the venue listed below.       Follow Up Recommendations Home health PT;Other (comment) (May progress well enough to not need HHPT) The potential need for Outpatient PT can be addressed at Ortho follow-up appointments.    Production managerquipment Recommendations  Wheelchair with elevating legrest; Other (comment) (Cosnider RW and 3in1, but anticipate that crutches will meet pt's needs)    Recommendations for Other Services       Precautions / Restrictions Precautions Precaution Comments: Ex-Fix Restrictions RLE Weight Bearing: Non weight bearing      Mobility  Bed Mobility Overal bed mobility: Needs Assistance Bed Mobility: Supine to Sit     Supine to sit: Supervision     General bed mobility comments: Cues for technqiue; Supervision mainly for lines  Transfers Overall transfer level: Needs assistance Equipment used: Rolling walker (2 wheeled) Transfers: Sit to/from Stand Sit to Stand: Min guard         General transfer comment: Minguard for safety; cues for hand placement and control  Ambulation/Gait Ambulation/Gait assistance: Min guard Ambulation Distance (Feet): 20 Feet (to and from bathroom) Assistive device: Rolling walker  (2 wheeled) Gait Pattern/deviations:  (hop-to pattern)     General Gait Details: Tim demonstrated very good support of body weight on RW; good job maintaining NWB  Information systems managertairs            Wheelchair Mobility    Modified Rankin (Stroke Patients Only)       Balance                                             Pertinent Vitals/Pain Pain Assessment: 0-10 Pain Score: 7  Pain Location: R LE; describes being able to feel pins, and is painful Pain Descriptors / Indicators: Aching Pain Intervention(s): Monitored during session;Repositioned;Ice applied    Home Living Family/patient expects to be discharged to:: Private residence Living Arrangements: Alone (plans to go to mother's home) Available Help at Discharge: Family;Available PRN/intermittently Type of Home: House Home Access: Stairs to enter Entrance Stairs-Rails: None Entrance Stairs-Number of Steps: 2 Home Layout: One level Home Equipment: Crutches Additional Comments: Has been managing for a few days on crutches    Prior Function Level of Independence: Independent               Hand Dominance        Extremity/Trunk Assessment   Upper Extremity Assessment: Overall WFL for tasks assessed           Lower Extremity Assessment: RLE deficits/detail RLE Deficits / Details: External Fixator on; toes with +active wiggle, and sensation grossly intact to light touch; Able to perform straight leg raise  Communication   Communication: No difficulties  Cognition Arousal/Alertness: Awake/alert Behavior During Therapy: WFL for tasks assessed/performed Overall Cognitive Status: Within Functional Limits for tasks assessed                      General Comments General comments (skin integrity, edema, etc.): Educated in optimal positioning for healing; worth considering foot plate to preserve dorsiflexion ROM    Exercises     Assessment/Plan    PT Assessment Patient needs  continued PT services  PT Problem List Decreased strength;Decreased range of motion;Decreased activity tolerance;Decreased mobility;Decreased knowledge of use of DME;Decreased knowledge of precautions;Other (comment)          PT Treatment Interventions DME instruction;Gait training;Stair training;Functional mobility training;Therapeutic activities;Therapeutic exercise;Balance training;Patient/family education    PT Goals (Current goals can be found in the Care Plan section)  Acute Rehab PT Goals Patient Stated Goal: Did not state PT Goal Formulation: With patient Time For Goal Achievement: 02/24/16 Potential to Achieve Goals: Good    Frequency Min 6X/week   Barriers to discharge        Co-evaluation               End of Session Equipment Utilized During Treatment: Gait belt Activity Tolerance: Patient tolerated treatment well Patient left: in chair;with call bell/phone within reach Nurse Communication: Mobility status         Time: [id number] PT Time Calculation (min) (ACUTE ONLY): 30 min   Charges:   PT Evaluation $PT Eval Moderate Complexity: 1 Procedure PT Treatments $Gait Training: 8-22 mins   PT G Codes:        Levi AlandHolly H Shannette Tabares 02/17/2016, 2:21 PM  Van ClinesHolly Gizella [place], PT  Acute Rehabilitation Services Pager [phone] Office [phone]

## 2016-02-17 NOTE — Progress Notes (Signed)
   Subjective: 1 Day Post-Op Procedure(s) (LRB): OPEN REDUCTION INTERNAL FIXATION (ORIF) RIGHT DISTAL TIBIA PILON FRACTURE (Right) EXTERNAL FIXATION RIGHT DISTAL TIBIA FOR REDUCTION AND LENGTH ACQUSITION OF PILON FRACTURE FOR PLATE FIXATAION OF FRACTURE (Right) Patient reports pain as moderate.    Objective: Vital signs in last 24 hours: Temp:  [98.4 F (36.9 C)-100.2 F (37.9 C)] 99.7 F (37.6 C) (12/05 0500) Pulse Rate:  [87-114] 92 (12/05 0500) Resp:  [6-20] 19 (12/05 0500) BP: (116-144)/(62-87) 118/66 (12/05 0500) SpO2:  [84 %-100 %] 100 % (12/05 0500) Weight:  [200 lb (90.7 kg)-207 lb (93.9 kg)] 207 lb (93.9 kg) (12/04 2035)  Intake/Output from previous day: 12/04 0701 - 12/05 0700 In: 2266.3 [P.O.:350; I.V.:1916.3] Out: 1430 [Urine:1400; Blood:30] Intake/Output this shift: No intake/output data recorded.   Recent Labs  02/16/16 1320  HGB 14.4    Recent Labs  02/16/16 1320  WBC 7.7  RBC 4.78  HCT 41.3  PLT 220    Recent Labs  02/16/16 1320  NA 139  K 3.7  CL 104  CO2 25  BUN 10  CREATININE 0.89  GLUCOSE 103*  CALCIUM 9.3   No results for input(s): LABPT, INR in the last 72 hours.  Neurologically intact Dg Ankle Complete Right  Result Date: 02/16/2016 CLINICAL DATA:  35 year old male with ankle fracture. Subsequent encounter. EXAM: DG C-ARM 61-120 MIN; RIGHT ANKLE - COMPLETE 3+ VIEW COMPARISON:  02/11/2016 CT. FINDINGS: Four intraoperative C-arm views submitted for review after surgery. Sideplate and screws have been utilized to transfix comminuted distal right tibial fracture. Better alignment of fracture fragments, with slight incongruent remaining. This can be assessed on follow-up plain film exam. Ankle mortise appears slightly widened. IMPRESSION: Open reduction and internal fixation of comminuted distal right tibia fracture. Electronically Signed   By: Lacy DuverneySteven  Olson M.D.   On: 02/16/2016 18:13   Dg C-arm 1-60 Min  Result Date:  02/16/2016 CLINICAL DATA:  35 year old male with ankle fracture. Subsequent encounter. EXAM: DG C-ARM 61-120 MIN; RIGHT ANKLE - COMPLETE 3+ VIEW COMPARISON:  02/11/2016 CT. FINDINGS: Four intraoperative C-arm views submitted for review after surgery. Sideplate and screws have been utilized to transfix comminuted distal right tibial fracture. Better alignment of fracture fragments, with slight incongruent remaining. This can be assessed on follow-up plain film exam. Ankle mortise appears slightly widened. IMPRESSION: Open reduction and internal fixation of comminuted distal right tibia fracture. Electronically Signed   By: Lacy DuverneySteven  Olson M.D.   On: 02/16/2016 18:13    Assessment/Plan: 1 Day Post-Op Procedure(s) (LRB): OPEN REDUCTION INTERNAL FIXATION (ORIF) RIGHT DISTAL TIBIA PILON FRACTURE (Right) EXTERNAL FIXATION RIGHT DISTAL TIBIA FOR REDUCTION AND LENGTH ACQUSITION OF PILON FRACTURE FOR PLATE FIXATAION OF FRACTURE (Right) Up with therapy    NEEDS FOOT ELEVATED WHEN HE GETS BACK IN BED AFTER THERAPY.   Eldred MangesMark C Franko Hilliker 02/17/2016, 7:45 AM

## 2016-02-17 NOTE — Anesthesia Postprocedure Evaluation (Signed)
Anesthesia Post Note  Patient: Shabazz Morais  Procedure(s) Performed: Procedure(s) (LRB): OPEN REDUCTION INTERNAL FIXATION (ORIF) RIGHT DISTAL TIBIA PILON FRACTURE (Right) EXTERNAL FIXATION RIGHT DISTAL TIBIA FOR REDUCTION AND LENGTH ACQUSITION OF PILON FRACTURE FOR PLATE FIXATAION OF FRACTURE (Right)  Patient location during evaluation: PACU Anesthesia Type: General Level of consciousness: awake and alert Pain management: pain level controlled Vital Signs Assessment: post-procedure vital signs reviewed and stable Respiratory status: spontaneous breathing, nonlabored ventilation, respiratory function stable and patient connected to nasal cannula oxygen Cardiovascular status: blood pressure returned to baseline and stable Postop Assessment: no signs of nausea or vomiting Anesthetic complications: no    Last Vitals:  Vitals:   02/17/16 0214 02/17/16 0500  BP: 116/62 118/66  Pulse: 94 92  Resp: 19 19  Temp: 37.7 C 37.6 C    Last Pain:  Vitals:   02/17/16 0500  TempSrc: Oral  PainSc:                  Dorisann Schwanke DAVID

## 2016-02-17 NOTE — Care Management Note (Signed)
Case Management Note  Patient Details  Name: Shane Montes MRN: [id number] Date of Birth: 08/07/1980  Subjective/Objective:                    Action/Plan:  Await PT/OT evals , [facility] Expected Discharge Date:  02/19/16               Expected Discharge Plan:     In-House Referral:     Discharge planning Services  CM Consult  Post Acute Care Choice:  Durable Medical Equipment, Home Health Choice offered to:     DME Arranged:    DME Agency:     HH Arranged:    HH Agency:     Status of Service:  In process, will continue to follow  If discussed at Long Length of Stay Meetings, dates discussed:    Additional Comments:  [name], [name], RN 02/17/2016, 2:04 PM

## 2016-02-17 NOTE — Consult Note (Signed)
CSW consult for SNF placement. PT/OT pending. CSW will follow for level of care recommendations and assist with SNF placement if needed.   Dellie BurnsJosie Fenix Rorke, MSW, LCSW [phone] (coverage)

## 2016-02-17 NOTE — Care Management (Addendum)
Home health PT;Other (comment) (May progress well enough to not need HHPT) The potential need for Outpatient PT can be addressed at Ortho follow-up appointments.    Equipment Recommendations Wheelchair with elevating legrest; Other (comment) (Cosnider RW and 3in1, but anticipate that crutches will meet pt's needs)    Will follow to see if HHPT ( will need MD order ) and RW and 3 in 1 needed. Ordered wheelchair  Will need MD signature .  Ronny FlurryHeather Landynn Dupler RN BSN [phone]

## 2016-02-18 ENCOUNTER — Telehealth (INDEPENDENT_AMBULATORY_CARE_PROVIDER_SITE_OTHER): Payer: Self-pay | Admitting: Orthopaedic Surgery

## 2016-02-18 LAB — BASIC METABOLIC PANEL
Anion gap: 11 (ref 5–15)
BUN: 6 mg/dL (ref 6–20)
CALCIUM: 8.8 mg/dL — AB (ref 8.9–10.3)
CO2: 27 mmol/L (ref 22–32)
CREATININE: 0.8 mg/dL (ref 0.61–1.24)
Chloride: 99 mmol/L — ABNORMAL LOW (ref 101–111)
Glucose, Bld: 119 mg/dL — ABNORMAL HIGH (ref 65–99)
Potassium: 4.2 mmol/L (ref 3.5–5.1)
SODIUM: 137 mmol/L (ref 135–145)

## 2016-02-18 MED ORDER — BISACODYL 10 MG RE SUPP: 10.0000 mg | Freq: Every day | RECTAL | Status: DC | PRN

## 2016-02-18 NOTE — Care Management Note (Signed)
Case Management Note  Patient Details  Name: Shane Montes MRN: [id number] Date of Birth: 03/09/1981  Subjective/Objective:                    Action/Plan: Discussed DME and home health PT/OT with PT and patient .   Patient is workers Comp   Workers Comp adjustor is Fabio AsaJayme J Moore phone [phone], Claim number is [id number] .  Spoke with Shane Montes , patient is not assigned Workers Comp Sports coachcase manager , Shane Montes will approve all DME and HHPT/OT that is recommended . Her Fax is [phone], her office hours are 0700 to 1530 Central Time .   Called Dr Ophelia CharterYates office spoke with Shanda BumpsJessica , regarding orders for HHPT/OT , DME awaiting call back. Expected Discharge Date:  02/19/16               Expected Discharge Plan:     In-House Referral:     Discharge planning Services  CM Consult  Post Acute Care Choice:  Durable Medical Equipment, Home Health Choice offered to:     DME Arranged:  3-N-1, Tub bench, Walker rolling, Wheelchair manual DME Agency:     HH Arranged:    HH Agency:     Status of Service:  In process, will continue to follow  If discussed at Long Length of Stay Meetings, dates discussed:    Additional Comments:  [name], Lisanne Ponce Marie, RN 02/18/2016, 3:22 PM

## 2016-02-18 NOTE — Telephone Encounter (Signed)
I text message to Dr. Ophelia CharterYates to advise as he is in surgery now. Gave him Heather's number to return call.

## 2016-02-18 NOTE — Evaluation (Signed)
Occupational Therapy Evaluation Patient Details Name: Shane Montes MRN: [id number] DOB: 03/18/1980 Today's Date: 02/18/2016    History of Present Illness Admitted after fall from ladder resulting in R tibial pilon fx; now s/p ORIF and External Fixator placement; NWB;  has a past medical history of Medical history non-contributory.    Clinical Impression   Pt admitted with the above diagnosis and has the deficits outlined below.  Pt would benefit from cont OT to address adl transfers (specifically tub transfers) onto a bench before returning home.  Pt lives alone but will d/c home to his mother's home due to his being up 10 steps.  Mother does work and will not be with pt 24/7.  Feel HHOT would be beneficial to look at the home set up and allow this pt to be as independent as possible with his adls in his mother's home.      Follow Up Recommendations  Home health OT;Supervision - Intermittent;Other (comment) (24 hour for first day or two)    Equipment Recommendations  3 in 1 bedside comode;Tub/shower bench    Recommendations for Other Services       Precautions / Restrictions Precautions Precautions: Fall Precaution Comments: Ex-Fix Restrictions Weight Bearing Restrictions: Yes RLE Weight Bearing: Non weight bearing      Mobility Bed Mobility Overal bed mobility: Needs Assistance Bed Mobility: Supine to Sit     Supine to sit: Supervision     General bed mobility comments: no physical assist needed.  HOB was at 30 degrees  Transfers Overall transfer level: Needs assistance Equipment used: Rolling walker (2 wheeled) Transfers: Sit to/from Stand Sit to Stand: Supervision         General transfer comment: Pt safe and used proper hand placement.  Cues given to slow down walking once up.    Balance Overall balance assessment: Needs assistance         Standing balance support: Bilateral upper extremity supported;During functional activity Standing  balance-Leahy Scale: Poor Standing balance comment: Pt must have walker at all times due to NWB status of RLE                            ADL Overall ADL's : Needs assistance/impaired Eating/Feeding: Independent;Sitting   Grooming: Wash/dry hands;Oral care;Standing;Min guard   Upper Body Bathing: Set up;Sitting   Lower Body Bathing: Minimal assistance;Sit to/from stand Lower Body Bathing Details (indicate cue type and reason): min assist when standing only Upper Body Dressing : Set up;Sitting   Lower Body Dressing: Moderate assistance;Sit to/from stand Lower Body Dressing Details (indicate cue type and reason): spoke at length with pt about LE clothining options. Mother is a Neurosurgeonseamstress so she can adapt a couple pairs of pants with velcro so pt does not have to take sweat pants/shorts over a fixator.   Toilet Transfer: Chartered certified accountantMin guard;RW;BSC;Regular Toilet;Ambulation Toilet Transfer Details (indicate cue type and reason): pt walked to bathroom with walker and used 3:1 over commode as he will need to do at home.  Pt moves well but in significant (9/10) pain Toileting- Clothing Manipulation and Hygiene: Supervision/safety;Sitting/lateral lean       Functional mobility during ADLs: Min guard;Rolling walker General ADL Comments: Pt does very well with adls but is limited by pain.  Pt cannot tolerate standing very long before pain becomes unbearable.     Vision Vision Assessment?: No apparent visual deficits   Perception Perception Perception Tested?: No   Praxis Praxis Praxis  tested?: Within functional limits    Pertinent Vitals/Pain Pain Assessment: 0-10 Pain Score: 9  Pain Location: RLE Pain Descriptors / Indicators: Aching;Stabbing;Throbbing Pain Intervention(s): Limited activity within patient's tolerance;Monitored during session;Premedicated before session;Repositioned     Hand Dominance Right   Extremity/Trunk Assessment Upper Extremity Assessment Upper Extremity  Assessment: Overall WFL for tasks assessed   Lower Extremity Assessment Lower Extremity Assessment: Defer to PT evaluation   Cervical / Trunk Assessment Cervical / Trunk Assessment: Normal   Communication Communication Communication: No difficulties   Cognition Arousal/Alertness: Awake/alert Behavior During Therapy: WFL for tasks assessed/performed Overall Cognitive Status: Within Functional Limits for tasks assessed                     General Comments       Exercises       Shoulder Instructions      Home Living Family/patient expects to be discharged to:: Private residence Living Arrangements: Alone Available Help at Discharge: Family;Available PRN/intermittently Type of Home: House Home Access: Stairs to enter Entergy CorporationEntrance Stairs-Number of Steps: 2 Entrance Stairs-Rails: None Home Layout: One level     Bathroom Shower/Tub: Tub/shower unit;Curtain Shower/tub characteristics: Engineer, building servicesCurtain Bathroom Toilet: Standard     Home Equipment: Crutches   Additional Comments: Has been managing for a few days on crutches      Prior Functioning/Environment Level of Independence: Independent        Comments: works for [employer] company        OT Problem List: Decreased activity tolerance;Impaired balance (sitting and/or standing);Decreased knowledge of use of DME or AE;Pain   OT Treatment/Interventions: Self-care/ADL training;DME and/or AE instruction;Therapeutic activities    OT Goals(Current goals can be found in the care plan section) Acute Rehab OT Goals Patient Stated Goal: for this not to hurt as much as it does OT Goal Formulation: With patient Time For Goal Achievement: 02/25/16 Potential to Achieve Goals: Good ADL Goals Pt Will Perform Tub/Shower Transfer: Tub transfer;tub bench;ambulating;rolling walker;with min guard assist  OT Frequency: Min 2X/week   Barriers to D/C: Decreased caregiver support  will live at mothers house.  Pt lives in apartement with 10  steps to enter       Co-evaluation              End of Session Equipment Utilized During Treatment: Engineer, waterolling walker Nurse Communication: Mobility status  Activity Tolerance: Patient limited by pain Patient left: in chair;with call bell/phone within reach   Time: 1033-1059 OT Time Calculation (min): 26 min Charges:  OT General Charges $OT Visit: 1 Procedure OT Evaluation $OT Eval Moderate Complexity: 1 Procedure OT Treatments $Self Care/Home Management : 8-22 mins G-Codes:    Hope BuddsJones, Athalee Esterline Anne 02/18/2016, 11:14 AM  [phone]

## 2016-02-18 NOTE — Progress Notes (Signed)
Physical Therapy Treatment Patient Details Name: Shane Montes MRN: [id number] DOB: 03/22/1980 Today's Date: 02/18/2016    History of Present Illness Admitted after fall from ladder resulting in R tibial pilon fx; now s/p ORIF and External Fixator placement; NWB;  has a past medical history of Medical history non-contributory.     PT Comments    Continuing progress with functional mobility and activity tolerance; still a considerable amount of pain when RLE is in a dependent position; we discussed optimal positioning in elevation and gentle dorsiflexion; Will need stair training next session  Follow Up Recommendations  Home health PT     Equipment Recommendations  Rolling walker with 5" wheels;3in1 (PT);Wheelchair (measurements PT) (elevating legrest)    Recommendations for Other Services       Precautions / Restrictions Precautions Precautions: Fall Precaution Comments: Ex-Fix Restrictions RLE Weight Bearing: Non weight bearing    Mobility  Bed Mobility Overal bed mobility: Needs Assistance Bed Mobility: Sit to Supine       Sit to supine: Supervision   General bed mobility comments: no physical assist needed.  HOB was at 30 degrees  Transfers Overall transfer level: Needs assistance Equipment used: Crutches Transfers: Sit to/from Stand Sit to Stand: Supervision         General transfer comment: Very good rise on LLE; minguard for safety and assist managing crutches  Ambulation/Gait Ambulation/Gait assistance: Min guard Ambulation Distance (Feet): 40 Feet Assistive device: Crutches Gait Pattern/deviations: Step-through pattern     General Gait Details: Slower, guarded gait with crutches, but overall good balance; one loss of balance, but able to recover without physical assist   Stairs            Wheelchair Mobility    Modified Rankin (Stroke Patients Only)       Balance                                    Cognition  Arousal/Alertness: Awake/alert Behavior During Therapy: WFL for tasks assessed/performed Overall Cognitive Status: Within Functional Limits for tasks assessed                      Exercises      General Comments General comments (skin integrity, edema, etc.): Minimal drainage from pin sites      Pertinent Vitals/Pain Pain Assessment: 0-10 Pain Score: 9  Pain Location: RLE Pain Descriptors / Indicators: Aching;Stabbing;Throbbing;Tightness Pain Intervention(s): Monitored during session;Limited activity within patient's tolerance;Repositioned (Elevated extremity)    Home Living                      Prior Function            PT Goals (current goals can now be found in the care plan section) Acute Rehab PT Goals Patient Stated Goal: for this not to hurt as much as it does PT Goal Formulation: With patient Time For Goal Achievement: 02/24/16 Potential to Achieve Goals: Good Progress towards PT goals: Progressing toward goals    Frequency    Min 6X/week      PT Plan Current plan remains appropriate    Co-evaluation             End of Session Equipment Utilized During Treatment: Gait belt Activity Tolerance: Patient tolerated treatment well Patient left: in bed;with call bell/phone within reach (RLE elevated)     Time: [id number] PT Time Calculation (min) (  ACUTE ONLY): 26 min  Charges:  $Gait Training: 8-22 mins $Therapeutic Activity: 8-22 mins                    G Codes:      Shane Montes 02/18/2016, 4:35 PM  Shane Montes, [place]PT  Acute Rehabilitation Services Pager [phone] Office [phone]

## 2016-02-18 NOTE — Care Management Note (Signed)
Case Management Note  Patient Details  Name: Shane Montes MRN: [id number] Date of Birth: 02/05/1981  Subjective/Objective:                    Action/Plan:  Discussed PT/OT recommendations with Dr Ophelia CharterYates , received verbal order for HHPT 3 times a week and wheelchair and 3 in 1 . At this time he does not feel tub bench , walker and HHOT are needed.   Referral given to [facility] along with Insurance adjustors information.  Patient aware of above and voiced understanding. Expected Discharge Date:  02/19/16               Expected Discharge Plan:  Home w Home Health Services  In-House Referral:     Discharge planning Services  CM Consult  Post Acute Care Choice:  Durable Medical Equipment, Home Health Choice offered to:  Patient  DME Arranged:  3-N-1, Wheelchair manual DME Agency:  [facility]  [facility]  [facility]  Status of Service:  Completed, signed off  If discussed at Long Length of Stay Meetings, dates discussed:    Additional Comments:  [name], Shane Witts Marie, RN 02/18/2016, 3:45 PM

## 2016-02-18 NOTE — Progress Notes (Signed)
   Subjective: 2 Days Post-Op Procedure(s) (LRB): OPEN REDUCTION INTERNAL FIXATION (ORIF) RIGHT DISTAL TIBIA PILON FRACTURE (Right) EXTERNAL FIXATION RIGHT DISTAL TIBIA FOR REDUCTION AND LENGTH ACQUSITION OF PILON FRACTURE FOR PLATE FIXATAION OF FRACTURE (Right) Patient reports pain as severe.    Objective: Vital signs in last 24 hours: Temp:  [98.4 F (36.9 C)-99.4 F (37.4 C)] 98.5 F (36.9 C) (12/06 0359) Pulse Rate:  [89-95] 89 (12/06 0359) Resp:  [17-18] 18 (12/06 0359) BP: (125-145)/(65-83) 141/83 (12/06 0359) SpO2:  [98 %-99 %] 99 % (12/06 0359)  Intake/Output from previous day: 12/05 0701 - 12/06 0700 In: 1638.3 [P.O.:1180; I.V.:458.3] Out: 1750 [Urine:1750] Intake/Output this shift: No intake/output data recorded.   Recent Labs  02/16/16 1320  HGB 14.4    Recent Labs  02/16/16 1320  WBC 7.7  RBC 4.78  HCT 41.3  PLT 220    Recent Labs  02/17/16 0636 02/18/16 0621  NA 137 137  K 3.8 4.2  CL 101 99*  CO2 26 27  BUN 7 6  CREATININE 0.89 0.80  GLUCOSE 120* 119*  CALCIUM 8.6* 8.8*   No results for input(s): LABPT, INR in the last 72 hours.  Neurologically intact No results found.  Assessment/Plan: 2 Days Post-Op Procedure(s) (LRB): OPEN REDUCTION INTERNAL FIXATION (ORIF) RIGHT DISTAL TIBIA PILON FRACTURE (Right) EXTERNAL FIXATION RIGHT DISTAL TIBIA FOR REDUCTION AND LENGTH ACQUSITION OF PILON FRACTURE FOR PLATE FIXATAION OF FRACTURE (Right) Up with therapy.   Dressing changed. Incision tight no skin necrosis but swelling and needs to keep leg elevated above heart. Shane Montes pt. Likely home tomorrow. Voiding OK no bowel movement yet.   Shane Montes 02/18/2016, 8:08 AM

## 2016-02-19 LAB — BASIC METABOLIC PANEL
ANION GAP: 10 (ref 5–15)
BUN: 9 mg/dL (ref 6–20)
CHLORIDE: 101 mmol/L (ref 101–111)
CO2: 27 mmol/L (ref 22–32)
Calcium: 9.1 mg/dL (ref 8.9–10.3)
Creatinine, Ser: 0.86 mg/dL (ref 0.61–1.24)
GFR calc Af Amer: >60 mL/min (ref >60)
Glucose, Bld: 99 mg/dL (ref 65–99)
POTASSIUM: 3.9 mmol/L (ref 3.5–5.1)
SODIUM: 138 mmol/L (ref 135–145)

## 2016-02-19 MED ORDER — POLYETHYLENE GLYCOL 3350 17 G PO PACK: 17.0000 g | PACK | Freq: Every day | ORAL | 0 refills | Status: AC | PRN

## 2016-02-19 MED ORDER — METHOCARBAMOL 500 MG PO TABS: 500.0000 mg | ORAL_TABLET | Freq: Four times a day (QID) | ORAL | 0 refills | Status: AC | PRN

## 2016-02-19 MED ORDER — ASPIRIN EC 325 MG PO TBEC: 325.0000 mg | DELAYED_RELEASE_TABLET | Freq: Every day | ORAL | 0 refills | Status: AC

## 2016-02-19 MED ORDER — POLYETHYLENE GLYCOL 3350 17 G PO PACK: 17.0000 g | PACK | Freq: Every day | ORAL | 1 refills | Status: DC | PRN

## 2016-02-19 MED ORDER — OXYCODONE-ACETAMINOPHEN 7.5-325 MG PO TABS: 1.0000 | ORAL_TABLET | Freq: Four times a day (QID) | ORAL | 0 refills | Status: AC | PRN

## 2016-02-19 NOTE — Progress Notes (Signed)
Occupational Therapy Treatment Patient Details Name: Shane Montes MRN: [id number] DOB: 03/10/1981 Today's Date: 02/19/2016    History of present illness Admitted after fall from ladder resulting in R tibial pilon fx; now s/p ORIF and External Fixator placement; NWB;  has a past medical history of Medical history non-contributory.    OT comments  Pt progressing towards acute OT goals. Focus of session was education on functional transfers and LB ADLs as well as functional mobility. ADL education reviewed. D/c plan remains appropriate. Pt reports his mother is not able to physically help. Continue to recommend HHOT at d/c to help increase functional independence and safety at d/c.   Follow Up Recommendations  Home health OT;Supervision - Intermittent;Other (comment)    Equipment Recommendations  Other (comment) (equipment delivered to hospital room already)    Recommendations for Other Services      Precautions / Restrictions Precautions Precautions: Fall Precaution Comments: Ex-Fix Restrictions Weight Bearing Restrictions: Yes RLE Weight Bearing: Non weight bearing       Mobility Bed Mobility               General bed mobility comments: up in chair  Transfers Overall transfer level: Needs assistance Equipment used: Crutches Transfers: Sit to/from Stand Sit to Stand: Supervision         General transfer comment: Very good rise on LLE; minguard for safety and assist managing crutches    Balance Overall balance assessment: Needs assistance Sitting-balance support: No upper extremity supported;Feet supported Sitting balance-Leahy Scale: Good     Standing balance support: Bilateral upper extremity supported;During functional activity Standing balance-Leahy Scale: Poor Standing balance comment: Pt must have assistive device at all times due to NWB status of RLE                   ADL Overall ADL's : Needs assistance/impaired                                      Functional mobility during ADLs: Min guard;Rolling walker General ADL Comments: Pt completed in-room functional mobility. Moves well but continues to be limited by increased pain the longer he is ambulatory. Educated on toilet trasnfer, pericare, shower transfer to 3n1 (tub bench denied). ADL educaiton reviewed as well as need for OOB activity balanced with rest.       Vision                     Perception     Praxis      Cognition   Behavior During Therapy: [facility]WFL for tasks assessed/performed Overall Cognitive Status: Within Functional Limits for tasks assessed                       Extremity/Trunk Assessment               Exercises     Shoulder Instructions       General Comments      Pertinent Vitals/ Pain       Pain Assessment: 0-10 Pain Score: 5  Pain Location: RLE Pain Descriptors / Indicators: Aching;Stabbing;Throbbing;Tightness Pain Intervention(s): Limited activity within patient's tolerance;Monitored during session;Repositioned  Home Living  Prior Functioning/Environment              Frequency  Min 2X/week        Progress Toward Goals  OT Goals(current goals can now be found in the care plan section)  Progress towards OT goals: Progressing toward goals  Acute Rehab OT Goals Patient Stated Goal: for this not to hurt as much as it does OT Goal Formulation: With patient Time For Goal Achievement: 02/25/16 Potential to Achieve Goals: Good ADL Goals Pt Will Perform Tub/Shower Transfer: Tub transfer;tub bench;ambulating;rolling walker;with min guard assist  Plan Discharge plan remains appropriate    Co-evaluation                 End of Session Equipment Utilized During Treatment: Other (comment) (crutches)   Activity Tolerance Patient tolerated treatment well;Patient limited by pain   Patient Left in chair;with call bell/phone  within reach;Other (comment) (RLE elevated)   Nurse Communication          Time: 1610-9604: 1419-1441 OT Time Calculation (min): 22 min  Charges: OT General Charges $OT Visit: 1 Procedure OT Treatments $Self Care/Home Management : 8-22 mins  Pilar GrammesMathews, Ryver Zadrozny H 02/19/2016, 3:00 PM

## 2016-02-19 NOTE — Progress Notes (Signed)
Physical Therapy Treatment Patient Details Name: Shane Montes MRN: [id number] DOB: 07/20/1980 Today's Date: 02/19/2016    History of Present Illness Admitted after fall from ladder resulting in R tibial pilon fx; now s/p ORIF and External Fixator placement; NWB;  has a past medical history of Medical history non-contributory.     PT Comments    Pt with increased activity tolerance as he was able to ambulate increased distance with supervision for safety. Pt limited 2/2 painful swelling in his foot and required a seated rest break during ambulation and was wheeled back to room on chair. Pt declined attempting stairs today due to high pain but was receptive to verbal education on stair navigation. Pt demonstrates good ability to transfer into and out of bed/chair and only needs assistance for crutch management. Pt continues to be a good candidate for HHPT to progress mobility and strength to return to PLOF.    Follow Up Recommendations  Home health PT     Equipment Recommendations  Rolling walker with 5" wheels;3in1 (PT);Wheelchair (measurements PT) (elevated legrest)    Recommendations for Other Services       Precautions / Restrictions Precautions Precautions: Fall Precaution Comments: Ex-Fix Restrictions Weight Bearing Restrictions: Yes RLE Weight Bearing: Non weight bearing    Mobility  Bed Mobility Overal bed mobility: Needs Assistance Bed Mobility: Sit to Supine     Supine to sit: Supervision Sit to supine: Supervision   General bed mobility comments: no physical assist needed.  HOB was at 30 degrees  Transfers Overall transfer level: Needs assistance Equipment used: Crutches Transfers: Sit to/from Stand Sit to Stand: Supervision         General transfer comment: Very good rise on LLE; minguard for safety and assist managing crutches  Ambulation/Gait Ambulation/Gait assistance: Supervision Ambulation Distance (Feet): 80 Feet Assistive device:  Crutches Gait Pattern/deviations: Step-through pattern     General Gait Details: Slow, steady gait with crutches; overall good balance. No LOB. Required seated rest break 2/2 pain in R foot from swelling   Stairs         General stair comments: was only able to verbally educate pt on stair management with crutches as he was in high pain due to swelling in RLE. Pt confirmed understanding  Wheelchair Mobility    Modified Rankin (Stroke Patients Only)       Balance Overall balance assessment: Needs assistance Sitting-balance support: No upper extremity supported;Feet supported Sitting balance-Leahy Scale: Good     Standing balance support: Bilateral upper extremity supported;During functional activity Standing balance-Leahy Scale: Poor Standing balance comment: Pt must have assistive device at all times due to NWB status of RLE                    Cognition Arousal/Alertness: Awake/alert Behavior During Therapy: WFL for tasks assessed/performed Overall Cognitive Status: Within Functional Limits for tasks assessed                      Exercises      General Comments General comments (skin integrity, edema, etc.): Minimal drainage from pin sites.       Pertinent Vitals/Pain Pain Assessment: 0-10 Pain Score: 5  Pain Location: RLE Pain Descriptors / Indicators: Aching;Stabbing;Throbbing;Tightness Pain Intervention(s): Monitored during session;Patient requesting pain meds-RN notified;Repositioned    Home Living                      Prior Function  PT Goals (current goals can now be found in the care plan section) Acute Rehab PT Goals Patient Stated Goal: for this not to hurt as much as it does Progress towards PT goals: Progressing toward goals    Frequency    Min 6X/week      PT Plan Current plan remains appropriate    Co-evaluation             End of Session Equipment Utilized During Treatment: Gait  belt Activity Tolerance: Patient tolerated treatment well Patient left: in chair;with call bell/phone within reach (RLE elevated)     Time: 1191-47821036-1056 PT Time Calculation (min) (ACUTE ONLY): 20 min  Charges:  $Gait Training: 8-22 mins                    G Codes:      Gaye PollackRebecca Kymora Sciara 02/19/2016, 1:55 PM Gaye Pollackebecca Cattie Tineo, SPT [phone]

## 2016-02-19 NOTE — Progress Notes (Signed)
Subjective: Doing well.  Pain better.  Making progress with PT.     Objective: Vital signs in last 24 hours: Temp:  [98.2 F (36.8 C)-99.5 F (37.5 C)] 98.2 F (36.8 C) (12/07 0450) Pulse Rate:  [90-97] 90 (12/07 0450) Resp:  [17-18] 18 (12/07 0450) BP: (124-142)/(57-93) 142/93 (12/07 0450) SpO2:  [97 %-100 %] 100 % (12/07 0450)  Intake/Output from previous day: 12/06 0701 - 12/07 0700 In: 1358 [P.O.:1320; I.V.:38] Out: 1750 [Urine:1750] Intake/Output this shift: Total I/O In: 360 [P.O.:360] Out: -    Recent Labs  02/16/16 1320  HGB 14.4    Recent Labs  02/16/16 1320  WBC 7.7  RBC 4.78  HCT 41.3  PLT 220    Recent Labs  02/18/16 0621 02/19/16 0501  NA 137 138  K 4.2 3.9  CL 99* 101  CO2 27 27  BUN 6 9  CREATININE 0.80 0.86  GLUCOSE 119* 99  CALCIUM 8.8* 9.1   No results for input(s): LABPT, INR in the last 72 hours.  Exam   Pleasant white male alert and oriented 3 and in no acute distress. External fixator intact. Dressing clean dry and intact. He does have some trouble dorsiflexing his toes. Good capillary refill. Assessment/Plan: We'll see patient does with therapy today. Possible discharge home later this afternoon. Prescription is on chart for Percocet, Robaxin. Sent prescription for MiraLAX to his pharmacy. We'll also take aspirin 325 mg daily for DVT prophylaxis.  Zonia KiefJames Aemon Koeller 02/19/2016, 9:59 AM

## 2016-02-19 NOTE — Telephone Encounter (Signed)
Dr. Ophelia CharterYates called Herbert SetaHeather last evening and advised.

## 2016-02-19 NOTE — Progress Notes (Signed)
Pt discharged to home.  Discharge instructions explained to pt.  Dressing changed and pin care completed before pt discharged.  Pt has no questions at the time of discharge.  Pt states he has all belongings.  IV removed.  Pt taken off unit in wheelchair by staff.

## 2016-02-20 ENCOUNTER — Telehealth (INDEPENDENT_AMBULATORY_CARE_PROVIDER_SITE_OTHER): Payer: Self-pay | Admitting: Radiology

## 2016-02-20 NOTE — Telephone Encounter (Signed)
Shane NovemberMike called about visits for patient, he states that they will see patient today and 2 times next week, and then patient will be re-evaluated by Dr. Ophelia CharterYates next week and then we can determine what the next step will be---I advised that this will work

## 2016-02-20 NOTE — Telephone Encounter (Signed)
I advised that this fine

## 2016-02-24 ENCOUNTER — Encounter (INDEPENDENT_AMBULATORY_CARE_PROVIDER_SITE_OTHER): Payer: Self-pay | Admitting: Orthopaedic Surgery

## 2016-02-24 ENCOUNTER — Ambulatory Visit (INDEPENDENT_AMBULATORY_CARE_PROVIDER_SITE_OTHER): Payer: Worker's Compensation

## 2016-02-24 ENCOUNTER — Ambulatory Visit (INDEPENDENT_AMBULATORY_CARE_PROVIDER_SITE_OTHER): Payer: Worker's Compensation | Admitting: Orthopaedic Surgery

## 2016-02-24 VITALS — BP 146/90 | HR 114 | Ht 72.0 in | Wt 207.0 lb

## 2016-02-24 DIAGNOSIS — S82891A Other fracture of right lower leg, initial encounter for closed fracture: Secondary | ICD-10-CM | POA: Diagnosis not present

## 2016-02-24 DIAGNOSIS — S82871D Displaced pilon fracture of right tibia, subsequent encounter for closed fracture with routine healing: Secondary | ICD-10-CM

## 2016-02-24 NOTE — Progress Notes (Signed)
   Office Visit Note   Patient: Shane Montes           Date of Birth: 08/05/1980           MRN: [id number] Visit Date: 02/24/2016              Requested by: No referring provider defined for this encounter. PCP: No PCP Per Patient   Assessment & Plan: Visit Diagnoses:  1. Ankle fracture, right, closed, initial encounter     Plan: Return office 1 week for wound check and scheduling for surgery the following week for external fixator removal.  Follow-Up Instructions: No Follow-up on file.   Orders:  Orders Placed This Encounter  Procedures  . XR Ankle Complete Right   No orders of the defined types were placed in this encounter.     Procedures: No procedures performed   Clinical Data: No additional findings.   Subjective: Chief Complaint  Patient presents with  . Right Ankle - Routine Post Op    [name] is 8 days out from ORIF of Right distal tibia pilon fracture with external fixation. States seems to be doing ok and he is taking percocet for pain.    Review of Systems Unchanged from hospitalization a days ago.  Objective: Vital Signs: BP (!) 146/90 (BP Location: Left Arm, Patient Position: Sitting)   Pulse (!) 114   Ht 6' (1.829 m)   Wt 207 lb (93.9 kg)   BMI 28.07 kg/m   Physical Exam incision looks good staples anterolateral. He has a slight erythematous ankle no blisters. Staples medial a few anterior where he had some additional screw fixation and K wire fixation looked good they are harvested. He has an external fixator on and he will return in 2 weeks and we can arrange scheduling for a hardware removal with removal of external fixator.  Ortho Exam incision looked good. No cellulitis is swelling is significantly down all Pentrax looked good.  Specialty Comments:  No specialty comments available.  Imaging: No results found.   PMFS History: Patient Active Problem List   Diagnosis Date Noted  . Closed displaced pilon fracture of  right tibia 02/16/2016   Past Medical History:  Diagnosis Date  . Medical history non-contributory     No family history on file.  Past Surgical History:  Procedure Laterality Date  . EXTERNAL FIXATION LEG Right 02/16/2016   Procedure: EXTERNAL FIXATION RIGHT DISTAL TIBIA FOR REDUCTION AND LENGTH ACQUSITION OF PILON FRACTURE FOR PLATE FIXATAION OF FRACTURE;  Surgeon: Eldred MangesMark C Hosie Sharman, MD;  Location: [facility] OR;  Service: Orthopedics;  Laterality: Right;  . FRACTURE SURGERY    . ORIF ANKLE FRACTURE Right 02/16/2016   DISTAL TIBIA PILON FRACTURE   . ORIF ANKLE FRACTURE Right 02/16/2016   Procedure: OPEN REDUCTION INTERNAL FIXATION (ORIF) RIGHT DISTAL TIBIA PILON FRACTURE;  Surgeon: Eldred MangesMark C Lilley Hubble, MD;  Location: [facility] OR;  Service: Orthopedics;  Laterality: Right;   Social History   Occupational History  . Not on file.   Social History Main Topics  . Smoking status: Never Smoker  . Smokeless tobacco: Never Used  . Alcohol use Yes     Comment: occasionally  . Drug use: No  . Sexual activity: Not on file

## 2016-03-02 ENCOUNTER — Ambulatory Visit (INDEPENDENT_AMBULATORY_CARE_PROVIDER_SITE_OTHER): Payer: Worker's Compensation

## 2016-03-02 ENCOUNTER — Encounter (INDEPENDENT_AMBULATORY_CARE_PROVIDER_SITE_OTHER): Payer: Self-pay | Admitting: Orthopaedic Surgery

## 2016-03-02 ENCOUNTER — Ambulatory Visit (INDEPENDENT_AMBULATORY_CARE_PROVIDER_SITE_OTHER): Payer: Worker's Compensation | Admitting: Orthopaedic Surgery

## 2016-03-02 DIAGNOSIS — S82871D Displaced pilon fracture of right tibia, subsequent encounter for closed fracture with routine healing: Secondary | ICD-10-CM

## 2016-03-02 DIAGNOSIS — M25571 Pain in right ankle and joints of right foot: Secondary | ICD-10-CM

## 2016-03-02 NOTE — Progress Notes (Signed)
   Post-Op Visit Note   Patient: Shane Montes           Date of Birth: 03/09/1981           MRN: [id number] Visit Date: 03/02/2016 PCP: No PCP Per Patient   Assessment & Plan: Cherlynn PoloStaples are harvested today he has no drainage. Incision for the anterolateral plate is not draining.  Chief Complaint: No chief complaint on file.  Visit Diagnoses:  1. Pain in right ankle and joints of right foot   2. Closed displaced pilon fracture of right tibia with routine healing, subsequent encounter     Plan: Staples are harvested today. Sutures for the anterolateral plate were left in. We will schedule surgery in 1 week for removal of external fixator and cast application with suture removal under anesthesia at the same time the external fixators removed and the cast is applied  Follow-Up Instructions: No Follow-up on file.   Orders:  Orders Placed This Encounter  Procedures  . XR Ankle Complete Right   No orders of the defined types were placed in this encounter.    PMFS History: Patient Active Problem List   Diagnosis Date Noted  . Closed displaced pilon fracture of right tibia 02/16/2016   Past Medical History:  Diagnosis Date  . Medical history non-contributory     No family history on file.  Past Surgical History:  Procedure Laterality Date  . EXTERNAL FIXATION LEG Right 02/16/2016   Procedure: EXTERNAL FIXATION RIGHT DISTAL TIBIA FOR REDUCTION AND LENGTH ACQUSITION OF PILON FRACTURE FOR PLATE FIXATAION OF FRACTURE;  Surgeon: Eldred MangesMark C Azlan Hanway, MD;  Location: [facility] OR;  Service: Orthopedics;  Laterality: Right;  . FRACTURE SURGERY    . ORIF ANKLE FRACTURE Right 02/16/2016   DISTAL TIBIA PILON FRACTURE   . ORIF ANKLE FRACTURE Right 02/16/2016   Procedure: OPEN REDUCTION INTERNAL FIXATION (ORIF) RIGHT DISTAL TIBIA PILON FRACTURE;  Surgeon: Eldred MangesMark C Ailsa Mireles, MD;  Location: [facility];  Service: Orthopedics;  Laterality: Right;   Social History   Occupational History  . Not on file.   Social  History Main Topics  . Smoking status: Never Smoker  . Smokeless tobacco: Never Used  . Alcohol use Yes     Comment: occasionally  . Drug use: No  . Sexual activity: Not on file

## 2016-03-02 NOTE — Discharge Summary (Signed)
Patient ID: Shane Montes MRN: [id number] DOB/AGE: 04-10-1980 35 y.o.  Admit date: 02/16/2016 Discharge date: 03/02/2016  Admission Diagnoses:  Active Problems:   Closed displaced pilon fracture of right tibia   Discharge Diagnoses:  Active Problems:   Closed displaced pilon fracture of right tibia  status post Procedure(s): OPEN REDUCTION INTERNAL FIXATION (ORIF) RIGHT DISTAL TIBIA PILON FRACTURE EXTERNAL FIXATION RIGHT DISTAL TIBIA FOR REDUCTION AND LENGTH ACQUSITION OF PILON FRACTURE FOR PLATE FIXATAION OF FRACTURE  Past Medical History:  Diagnosis Date  . Medical history non-contributory     Surgeries: Procedure(s): OPEN REDUCTION INTERNAL FIXATION (ORIF) RIGHT DISTAL TIBIA PILON FRACTURE EXTERNAL FIXATION RIGHT DISTAL TIBIA FOR REDUCTION AND LENGTH ACQUSITION OF PILON FRACTURE FOR PLATE FIXATAION OF FRACTURE on 02/16/2016   Consultants:   Discharged Condition: Improved  Hospital Course: Shane Montes is an 35 y.o. male who was admitted 02/16/2016 for operative treatment of distal tibia fracture. Patient failed conservative treatments (please see the history and physical for the specifics) and had severe unremitting pain that affects sleep, daily activities and work/hobbies. After pre-op clearance, the patient was taken to the operating room on 02/16/2016 and underwent  Procedure(s): OPEN REDUCTION INTERNAL FIXATION (ORIF) RIGHT DISTAL TIBIA PILON FRACTURE EXTERNAL FIXATION RIGHT DISTAL TIBIA FOR REDUCTION AND LENGTH ACQUSITION OF PILON FRACTURE FOR PLATE FIXATAION OF FRACTURE.    Patient was given perioperative antibiotics:  Anti-infectives    Start     Dose/Rate Route Frequency Ordered Stop   02/16/16 2300  ceFAZolin (ANCEF) IVPB 1 g/50 mL premix     1 g 100 mL/hr over 30 Minutes Intravenous Every 8 hours 02/16/16 2033 02/17/16 1634   02/16/16 0600  ceFAZolin (ANCEF) IVPB 2g/100 mL premix     2 g 200 mL/hr over 30 Minutes Intravenous On call to O.R.  02/15/16 1401 02/16/16 1550       Patient was given sequential compression devices and early ambulation to prevent DVT.   Patient benefited maximally from hospital stay and there were no complications. At the time of discharge, the patient was urinating/moving their bowels without difficulty, tolerating a regular diet, pain is controlled with oral pain medications and they have been cleared by PT/OT.   Recent vital signs: No data found.    Recent laboratory studies: No results for input(s): WBC, HGB, HCT, PLT, NA, K, CL, CO2, BUN, CREATININE, GLUCOSE, INR, CALCIUM in the last 72 hours.  Invalid input(s): PT, 2   Discharge Medications:   Allergies as of 02/19/2016      Reactions   No Known Allergies       Medication List    STOP taking these medications   ibuprofen 200 MG tablet Commonly known as:  ADVIL,MOTRIN   ibuprofen 600 MG tablet Commonly known as:  ADVIL,MOTRIN   oxyCODONE-acetaminophen 5-325 MG tablet Commonly known as:  PERCOCET Replaced by:  oxyCODONE-acetaminophen 7.5-325 MG tablet     TAKE these medications   aspirin EC 325 MG tablet Take 1 tablet (325 mg total) by mouth daily.   methocarbamol 500 MG tablet Commonly known as:  ROBAXIN Take 1 tablet (500 mg total) by mouth every 6 (six) hours as needed for muscle spasms.   oxyCODONE-acetaminophen 7.5-325 MG tablet Commonly known as:  PERCOCET Take 1-2 tablets by mouth every 6 (six) hours as needed for severe pain. Replaces:  oxyCODONE-acetaminophen 5-325 MG tablet   polyethylene glycol packet Commonly known as:  MIRALAX / GLYCOLAX Take 17 g by mouth daily as needed for mild constipation.  Diagnostic Studies: Dg Ankle Complete Right  Result Date: 02/16/2016 CLINICAL DATA:  35 year old male with ankle fracture. Subsequent encounter. EXAM: DG C-ARM 61-120 MIN; RIGHT ANKLE - COMPLETE 3+ VIEW COMPARISON:  02/11/2016 CT. FINDINGS: Four intraoperative C-arm views submitted for review after surgery.  Sideplate and screws have been utilized to transfix comminuted distal right tibial fracture. Better alignment of fracture fragments, with slight incongruent remaining. This can be assessed on follow-up plain film exam. Ankle mortise appears slightly widened. IMPRESSION: Open reduction and internal fixation of comminuted distal right tibia fracture. Electronically Signed   By: Lacy DuverneySteven  Olson M.D.   On: 02/16/2016 18:13   Dg Ankle Complete Right  Result Date: 02/11/2016 CLINICAL DATA:  Fall. EXAM: RIGHT ANKLE - COMPLETE 3+ VIEW COMPARISON:  None. FINDINGS: There is a comminuted, intra-articular fracture deformity involving the distal tibia. A minimally displaced medial malleolar fracture is identified. Moderately displaced posterior malleolar fracture as well as a fracture involving the medial aspect of the distal tibia which extends into the tibiotalar joint. Diffuse soft tissue swelling identified. A small avulsion fracture arises from the distal tip of the lateral malleolus. IMPRESSION: 1. Comminuted fracture deformity involves the distal tibia including the medial malleolus, posterior malleolus and lateral articular surface of the tibiotalar joint. There is also an avulsion fracture arising from the tip of the lateral malleolus. Electronically Signed   By: Signa Kellaylor  Stroud M.D.   On: 02/11/2016 18:05   Ct Ankle Right Wo Contrast  Result Date: 02/11/2016 CLINICAL DATA:  Ankle injury. Right ankle pain. Larey SeatFell off ladder today. Fractures on radiographs. Initial encounter. EXAM: CT OF THE RIGHT ANKLE WITHOUT CONTRAST TECHNIQUE: Multidetector CT imaging of the right ankle was performed according to the standard protocol. Multiplanar CT image reconstructions were also generated. COMPARISON:  Right ankle radiographs earlier today FINDINGS: As described on earlier radiographs, there is a highly comminuted intra-articular fracture of the distal tibia. The posterior malleolus fragment is displaced 1.4 cm posteriorly.  There is no significant displacement of the medial malleolus fracture. There is an approximately 1.4 cm articular surface defect involving the lateral aspect of the tibial plafond. Punctate osseous fragments are present within the ankle joint, predominantly posteriorly. There is no dislocation. A tiny avulsion fracture is again noted of the tip of the lateral malleolus. The talus, calcaneus, bones of the midfoot, and visualized proximal portions of the metatarsals appear intact. There is mild spurring at the Achilles insertion. An ankle joint effusion is noted, and there is soft tissue swelling and hematoma involving the lateral greater than medial aspects of the ankle and hindfoot. IMPRESSION: 1. Comminuted, intra-articular fracture of the distal tibia as above. 2. Tiny avulsion fracture of the lateral malleolus. Electronically Signed   By: Sebastian AcheAllen  Grady M.D.   On: 02/11/2016 19:22   Dg C-arm 1-60 Min  Result Date: 02/16/2016 CLINICAL DATA:  35 year old male with ankle fracture. Subsequent encounter. EXAM: DG C-ARM 61-120 MIN; RIGHT ANKLE - COMPLETE 3+ VIEW COMPARISON:  02/11/2016 CT. FINDINGS: Four intraoperative C-arm views submitted for review after surgery. Sideplate and screws have been utilized to transfix comminuted distal right tibial fracture. Better alignment of fracture fragments, with slight incongruent remaining. This can be assessed on follow-up plain film exam. Ankle mortise appears slightly widened. IMPRESSION: Open reduction and internal fixation of comminuted distal right tibia fracture. Electronically Signed   By: Lacy DuverneySteven  Olson M.D.   On: 02/16/2016 18:13   Xr Ankle Complete Right  Result Date: 02/24/2016 Three-view x-rays right ankle post ORIF. Fracture  shows satisfactory position no evidence of backout of screws. Postoperative staples are noted in the skin. Impression: Postop ORIF right distal tibia fracture was satisfactory restoration of the joint space. Posterior malleolus may have  slight angulation. Significant improvement in overall position from preop     Follow-up Information    Eldred MangesMark C Yates, MD. Schedule an appointment as soon as possible for a visit.   Specialty:  Orthopedic Surgery Why:  need return office visit one week.  return sooner if needed or call office if any questions.  Contact information: [address] [place] [address] [phone]           Discharge Plan:  discharge to home Disposition:     Signed: Zonia KiefJames Clovis Mankins for Dr. Annell GreeningMark Yates 03/02/2016, 3:29 PM

## 2016-03-09 ENCOUNTER — Encounter (HOSPITAL_COMMUNITY): Payer: Self-pay | Admitting: *Deleted

## 2016-03-10 ENCOUNTER — Encounter (HOSPITAL_COMMUNITY)
Admission: RE | Disposition: A | Discharge status: Home / Self Care | Payer: Self-pay | Source: Ambulatory Visit | Attending: Orthopaedic Surgery

## 2016-03-10 ENCOUNTER — Encounter (HOSPITAL_COMMUNITY): Payer: Self-pay | Admitting: Urology

## 2016-03-10 ENCOUNTER — Ambulatory Visit (HOSPITAL_COMMUNITY): Payer: Worker's Compensation | Admitting: Anesthesiology

## 2016-03-10 ENCOUNTER — Ambulatory Visit (HOSPITAL_COMMUNITY)
Admission: RE | Admit: 2016-03-10 | Discharge: 2016-03-10 | Disposition: A | Discharge status: Home / Self Care | Payer: Worker's Compensation | Source: Ambulatory Visit | Attending: Orthopaedic Surgery | Admitting: Orthopaedic Surgery

## 2016-03-10 DIAGNOSIS — Z460 Encounter for fitting and adjustment of spectacles and contact lenses: Secondary | ICD-10-CM | POA: Insufficient documentation

## 2016-03-10 DIAGNOSIS — Z7982 Long term (current) use of aspirin: Secondary | ICD-10-CM | POA: Insufficient documentation

## 2016-03-10 DIAGNOSIS — Z79899 Other long term (current) drug therapy: Secondary | ICD-10-CM | POA: Diagnosis not present

## 2016-03-10 DIAGNOSIS — S82871D Displaced pilon fracture of right tibia, subsequent encounter for closed fracture with routine healing: Secondary | ICD-10-CM | POA: Diagnosis not present

## 2016-03-10 HISTORY — PX: EXTERNAL FIXATION REMOVAL: SHX5040

## 2016-03-10 LAB — CBC
HEMATOCRIT: 42 % (ref 39.0–52.0)
HEMOGLOBIN: 14.8 g/dL (ref 13.0–17.0)
MCH: 30.5 pg (ref 26.0–34.0)
MCHC: 35.2 g/dL (ref 30.0–36.0)
MCV: 86.6 fL (ref 78.0–100.0)
Platelets: 231 10*3/uL (ref 150–400)
RBC: 4.85 MIL/uL (ref 4.22–5.81)
RDW: 12.3 % (ref 11.5–15.5)
WBC: 6.2 10*3/uL (ref 4.0–10.5)

## 2016-03-10 LAB — NO BLOOD PRODUCTS

## 2016-03-10 SURGERY — REMOVAL, EXTERNAL FIXATION DEVICE, LOWER EXTREMITY
Anesthesia: Monitor Anesthesia Care | Laterality: Right

## 2016-03-10 MED ORDER — LIDOCAINE 2% (20 MG/ML) 5 ML SYRINGE
INTRAMUSCULAR | Status: AC
  Filled 2016-03-10: qty 5

## 2016-03-10 MED ORDER — FENTANYL CITRATE (PF) 100 MCG/2ML IJ SOLN
INTRAMUSCULAR | Status: DC | PRN
  Administered 2016-03-10: 50 ug via INTRAVENOUS
  Administered 2016-03-10 (×2): 25 ug via INTRAVENOUS

## 2016-03-10 MED ORDER — 0.9 % SODIUM CHLORIDE (POUR BTL) OPTIME
TOPICAL | Status: DC | PRN
  Administered 2016-03-10: 1000 mL

## 2016-03-10 MED ORDER — LACTATED RINGERS IV SOLN
INTRAVENOUS | Status: DC
  Administered 2016-03-10 (×2): via INTRAVENOUS

## 2016-03-10 MED ORDER — FENTANYL CITRATE (PF) 100 MCG/2ML IJ SOLN
INTRAMUSCULAR | Status: AC
  Filled 2016-03-10: qty 2

## 2016-03-10 MED ORDER — PROPOFOL 500 MG/50ML IV EMUL
INTRAVENOUS | Status: DC | PRN
  Administered 2016-03-10: 100 ug/kg/min via INTRAVENOUS

## 2016-03-10 MED ORDER — MIDAZOLAM HCL 2 MG/2ML IJ SOLN
INTRAMUSCULAR | Status: AC
  Filled 2016-03-10: qty 2

## 2016-03-10 MED ORDER — CEFAZOLIN SODIUM-DEXTROSE 2-4 GM/100ML-% IV SOLN
2.0000 g | INTRAVENOUS | Status: AC
  Administered 2016-03-10: 2 g via INTRAVENOUS

## 2016-03-10 MED ORDER — MIDAZOLAM HCL 5 MG/5ML IJ SOLN
INTRAMUSCULAR | Status: DC | PRN
  Administered 2016-03-10: 2 mg via INTRAVENOUS

## 2016-03-10 MED ORDER — HYDROMORPHONE HCL 1 MG/ML IJ SOLN
INTRAMUSCULAR | Status: AC
  Filled 2016-03-10: qty 0.5

## 2016-03-10 MED ORDER — LIDOCAINE 2% (20 MG/ML) 5 ML SYRINGE
INTRAMUSCULAR | Status: DC | PRN
  Administered 2016-03-10: 25 mg via INTRAVENOUS

## 2016-03-10 MED ORDER — CHLORHEXIDINE GLUCONATE 4 % EX LIQD: 60.0000 mL | Freq: Once | CUTANEOUS | Status: DC

## 2016-03-10 MED ORDER — PROPOFOL 10 MG/ML IV BOLUS
INTRAVENOUS | Status: AC
  Filled 2016-03-10: qty 20

## 2016-03-10 MED ORDER — CEFAZOLIN SODIUM-DEXTROSE 2-4 GM/100ML-% IV SOLN
INTRAVENOUS | Status: AC
  Filled 2016-03-10: qty 100

## 2016-03-10 MED ORDER — HYDROMORPHONE HCL 1 MG/ML IJ SOLN
0.2500 mg | INTRAMUSCULAR | Status: DC | PRN
  Administered 2016-03-10: 0.5 mg via INTRAVENOUS

## 2016-03-10 MED ORDER — PROMETHAZINE HCL 25 MG/ML IJ SOLN: 6.2500 mg | INTRAMUSCULAR | Status: DC | PRN

## 2016-03-10 SURGICAL SUPPLY — 45 items
BANDAGE ACE 4X5 VEL STRL LF (GAUZE/BANDAGES/DRESSINGS) IMPLANT
BANDAGE ACE 6X5 VEL STRL LF (GAUZE/BANDAGES/DRESSINGS) ×3 IMPLANT
BANDAGE ESMARK 6X9 LF (GAUZE/BANDAGES/DRESSINGS) ×1 IMPLANT
BNDG COHESIVE 6X5 TAN STRL LF (GAUZE/BANDAGES/DRESSINGS) IMPLANT
BNDG ESMARK 6X9 LF (GAUZE/BANDAGES/DRESSINGS) ×3
BNDG GAUZE ELAST 4 BULKY (GAUZE/BANDAGES/DRESSINGS) IMPLANT
COVER SURGICAL LIGHT HANDLE (MISCELLANEOUS) ×3 IMPLANT
DRAPE C-ARM 42X72 X-RAY (DRAPES) IMPLANT
DRAPE U-SHAPE 47X51 STRL (DRAPES) ×3 IMPLANT
DRSG ADAPTIC 3X8 NADH LF (GAUZE/BANDAGES/DRESSINGS) IMPLANT
ELECT REM PT RETURN 9FT ADLT (ELECTROSURGICAL) ×3
ELECTRODE REM PT RTRN 9FT ADLT (ELECTROSURGICAL) ×1 IMPLANT
GAUZE SPONGE 4X4 12PLY STRL (GAUZE/BANDAGES/DRESSINGS) IMPLANT
GAUZE XEROFORM 5X9 LF (GAUZE/BANDAGES/DRESSINGS) ×3 IMPLANT
GLOVE BIOGEL PI IND STRL 8 (GLOVE) ×2 IMPLANT
GLOVE BIOGEL PI INDICATOR 8 (GLOVE) ×4
GLOVE ORTHO TXT STRL SZ7.5 (GLOVE) ×6 IMPLANT
GOWN STRL REUS W/ TWL LRG LVL3 (GOWN DISPOSABLE) ×1 IMPLANT
GOWN STRL REUS W/ TWL XL LVL3 (GOWN DISPOSABLE) ×1 IMPLANT
GOWN STRL REUS W/TWL 2XL LVL3 (GOWN DISPOSABLE) ×3 IMPLANT
GOWN STRL REUS W/TWL LRG LVL3 (GOWN DISPOSABLE) ×2
GOWN STRL REUS W/TWL XL LVL3 (GOWN DISPOSABLE) ×2
KIT BASIN OR (CUSTOM PROCEDURE TRAY) ×3 IMPLANT
KIT ROOM TURNOVER OR (KITS) ×3 IMPLANT
MANIFOLD NEPTUNE II (INSTRUMENTS) ×3 IMPLANT
NS IRRIG 1000ML POUR BTL (IV SOLUTION) ×3 IMPLANT
PACK ORTHO EXTREMITY (CUSTOM PROCEDURE TRAY) ×3 IMPLANT
PAD ARMBOARD 7.5X6 YLW CONV (MISCELLANEOUS) ×3 IMPLANT
PADDING CAST ABS 6INX4YD NS (CAST SUPPLIES) ×2
PADDING CAST ABS COTTON 6X4 NS (CAST SUPPLIES) ×1 IMPLANT
PADDING CAST COTTON 6X4 STRL (CAST SUPPLIES) ×9 IMPLANT
PENCIL BUTTON HOLSTER BLD 10FT (ELECTRODE) IMPLANT
SPONGE GAUZE 4X4 12PLY STER LF (GAUZE/BANDAGES/DRESSINGS) ×3 IMPLANT
SPONGE LAP 18X18 X RAY DECT (DISPOSABLE) ×3 IMPLANT
SPONGE SCRUB IODOPHOR (GAUZE/BANDAGES/DRESSINGS) IMPLANT
STOCKINETTE IMPERVIOUS LG (DRAPES) ×3 IMPLANT
SUT ETHILON 3 0 PS 1 (SUTURE) IMPLANT
SUT VIC AB 0 CT1 27 (SUTURE)
SUT VIC AB 0 CT1 27XBRD ANBCTR (SUTURE) IMPLANT
SUT VIC AB 2-0 CT1 27 (SUTURE)
SUT VIC AB 2-0 CT1 TAPERPNT 27 (SUTURE) IMPLANT
TOWEL OR 17X24 6PK STRL BLUE (TOWEL DISPOSABLE) ×6 IMPLANT
TOWEL OR 17X26 10 PK STRL BLUE (TOWEL DISPOSABLE) ×3 IMPLANT
UNDERPAD 30X30 (UNDERPADS AND DIAPERS) ×3 IMPLANT
WATER STERILE IRR 1000ML POUR (IV SOLUTION) IMPLANT

## 2016-03-10 NOTE — Transfer of Care (Signed)
Immediate Anesthesia Transfer of Care Note  Patient: Shane Montes  Procedure(s) Performed: Procedure(s): REMOVAL EXTERNAL FIXATION RIGHT ANKLE, SUTURE REMOVAL (Right)  Patient Location: PACU  Anesthesia Type:MAC  Level of Consciousness: patient cooperative and responds to stimulation  Airway & Oxygen Therapy: Patient Spontanous Breathing  Post-op Assessment: Report given to RN and Post -op Vital signs reviewed and stable  Post vital signs: Reviewed and stable  Last Vitals:  Vitals:   03/10/16 1333 03/10/16 1646  BP: (!) 144/88 125/81  Pulse: (!) 108 78  Resp: 20 15  Temp: 36.9 C 36.7 C    Last Pain:  Vitals:   03/10/16 1333  TempSrc: Oral  PainSc: 2          Complications: No apparent anesthesia complications

## 2016-03-10 NOTE — Anesthesia Preprocedure Evaluation (Addendum)
Anesthesia Evaluation  Patient identified by MRN, date of birth, ID band Patient awake    Reviewed: Allergy & Precautions, NPO status , Patient's Chart, lab work & pertinent test results  Airway Mallampati: I  TM Distance: >3 FB Neck ROM: Full    Dental  (+) Teeth Intact   Pulmonary neg pulmonary ROS,    breath sounds clear to auscultation       Cardiovascular negative cardio ROS   Rhythm:Regular Rate:Normal     Neuro/Psych negative neurological ROS  negative psych ROS   GI/Hepatic negative GI ROS, Neg liver ROS,   Endo/Other  negative endocrine ROS  Renal/GU negative Renal ROS  negative genitourinary   Musculoskeletal negative musculoskeletal ROS (+)   Abdominal   Peds negative pediatric ROS (+)  Hematology negative hematology ROS (+) JEHOVAH'S WITNESS  Anesthesia Other Findings   Reproductive/Obstetrics negative OB ROS                            Anesthesia Physical Anesthesia Plan  ASA: I  Anesthesia Plan: MAC   Post-op Pain Management:    Induction: Intravenous  Airway Management Planned: Simple Face Mask and Natural Airway  Additional Equipment:   Intra-op Plan:   Post-operative Plan:   Informed Consent:   Plan Discussed with:   Anesthesia Plan Comments:         Anesthesia Quick Evaluation

## 2016-03-10 NOTE — Progress Notes (Signed)
Orthopedic Tech Progress Note Patient Details:  Shane Montes 01/01/1981 [id number]  Ortho Devices Type of Ortho Device: CAM walker Ortho Device/Splint Interventions: Ordered, Application   Jennye MoccasinHughes, Masen Luallen Craig 03/10/2016, 4:39 PM

## 2016-03-10 NOTE — H&P (Addendum)
Shane Montes is an 35 y.o. male.   Chief Complaint:retained externor right ankle post ORIF with retained external fixato ,rdistal tibial plafond fracture HPI: Patient had the tibia fracture as listed below. He underwent external fixator application and ORIF with plating. He now returns for suture removal under anesthesia and removal of the external fixator. `  Past Medical History:  Diagnosis Date  . Medical history non-contributory     Past Surgical History:  Procedure Laterality Date  . EXTERNAL FIXATION LEG Right 02/16/2016   Procedure: EXTERNAL FIXATION RIGHT DISTAL TIBIA FOR REDUCTION AND LENGTH ACQUSITION OF PILON FRACTURE FOR PLATE FIXATAION OF FRACTURE;  Surgeon: Eldred MangesMark C Cope Marte, MD;  Location: [facility] OR;  Service: Orthopedics;  Laterality: Right;  . FRACTURE SURGERY    . ORIF ANKLE FRACTURE Right 02/16/2016   DISTAL TIBIA PILON FRACTURE   . ORIF ANKLE FRACTURE Right 02/16/2016   Procedure: OPEN REDUCTION INTERNAL FIXATION (ORIF) RIGHT DISTAL TIBIA PILON FRACTURE;  Surgeon: Eldred MangesMark C Lachlan Mckim, MD;  Location: [facility] OR;  Service: Orthopedics;  Laterality: Right;    History reviewed. No pertinent family history. Social History:  reports that he has never smoked. He has never used smokeless tobacco. He reports that he drinks alcohol. He reports that he does not use drugs.  Allergies:  Allergies  Allergen Reactions  . No Known Allergies     Medications Prior to Admission  Medication Sig Dispense Refill  . aspirin EC 325 MG tablet Take 1 tablet (325 mg total) by mouth daily. 30 tablet 0  . CALCIUM PO Take 1 tablet by mouth daily.    . Hypromellose (ARTIFICIAL TEARS OP) Apply 1 drop to eye daily as needed (dry eyes).    . methocarbamol (ROBAXIN) 500 MG tablet Take 1 tablet (500 mg total) by mouth every 6 (six) hours as needed for muscle spasms. 60 tablet 0  . Multiple Vitamin (MULTIVITAMIN WITH MINERALS) TABS tablet Take 1 tablet by mouth daily.    Marland Kitchen. oxyCODONE-acetaminophen (PERCOCET)  7.5-325 MG tablet Take 1-2 tablets by mouth every 6 (six) hours as needed for severe pain. (Patient taking differently: Take 0.5-0.75 tablets by mouth every 6 (six) hours as needed for severe pain. ) 60 tablet 0  . polyethylene glycol (MIRALAX / GLYCOLAX) packet Take 17 g by mouth daily as needed for mild constipation. 14 each 0    Results for orders placed or performed during the hospital encounter of 03/10/16 (from the past 48 hour(s))  CBC     Status: None   Collection Time: 03/10/16  1:25 PM  Result Value Ref Range   WBC 6.2 4.0 - 10.5 K/uL   RBC 4.85 4.22 - 5.81 MIL/uL   Hemoglobin 14.8 13.0 - 17.0 g/dL   HCT 69.642.0 29.539.0 - 28.452.0 %   MCV 86.6 78.0 - 100.0 fL   MCH 30.5 26.0 - 34.0 pg   MCHC 35.2 30.0 - 36.0 g/dL   RDW 13.212.3 44.011.5 - 10.215.5 %   Platelets 231 150 - 400 K/uL  No blood products     Status: None   Collection Time: 03/10/16  1:32 PM  Result Value Ref Range   Transfuse no blood products      TRANSFUSE NO BLOOD PRODUCTS, VERIFIED BY RN MEREDITH ROSENBERGER   No results found.  Review of Systems  All other systems reviewed and are negative.   Blood pressure (!) 144/88, pulse (!) 108, temperature 98.4 F (36.9 C), temperature source Oral, resp. rate 20, SpO2 99 %. Physical Exam  Constitutional: He appears well-developed and well-nourished.  HENT:  Head: Normocephalic.  Eyes: Conjunctivae are normal. Pupils are equal, round, and reactive to light.  Neck: Normal range of motion. Neck supple.  Cardiovascular: Normal rate.   Respiratory: Effort normal.  GI: Soft.  Musculoskeletal:  Right external fixator across the ankle. No drainage from Pentrax. Anterior incision is well-healed from anterolateral distal tibial plate fixation  Neurological: He is alert.  Skin: Skin is warm and dry.  Psychiatric: He has a normal mood and affect. His behavior is normal. Judgment and thought content normal.     Assessment/Plan Plan removal of external fixator under general anesthesia and  suture removal.   Eldred MangesMark C Emmry Hinsch, MD 03/10/2016, 4:05 PM

## 2016-03-10 NOTE — Anesthesia Postprocedure Evaluation (Signed)
Anesthesia Post Note  Patient: Shane Montes  Procedure(s) Performed: Procedure(s) (LRB): REMOVAL EXTERNAL FIXATION RIGHT ANKLE, SUTURE REMOVAL (Right)  Patient location during evaluation: PACU Anesthesia Type: MAC Level of consciousness: awake and sedated Pain management: pain level controlled Vital Signs Assessment: post-procedure vital signs reviewed and stable Respiratory status: spontaneous breathing, nonlabored ventilation, respiratory function stable and patient connected to nasal cannula oxygen Cardiovascular status: blood pressure returned to baseline and stable Postop Assessment: no signs of nausea or vomiting Anesthetic complications: no       Last Vitals:  Vitals:   03/10/16 1700 03/10/16 1715  BP: (!) 138/97 137/85  Pulse: 88 76  Resp: 20 13  Temp:      Last Pain:  Vitals:   03/10/16 1715  TempSrc:   PainSc: 5                  Murrel Bertram,JAMES TERRILL

## 2016-03-10 NOTE — Op Note (Signed)
Preop diagnosis: tibial pilon fracture right with retained external fixator  Postop diagnosis: Same  Procedure: Removal of external fixator right ankle. Suture removal.  Anesthesia: Mac  Surgeon: Annell GreeningMark Tamekia Rotter M.D.  Assistant: April Fulp FU LP RN FA  Procedure: After timeout procedure Ancef prophylaxis rubbing with the Betadine scrub and Betadine solution was applied and extremity sheet. Bone cutter was used to cut the calcaneal pin. External fixator was disconnected and was used to slowly back up to the bicortical tibial pins. External fixator was removed. Pin tracks were scrubbed Xeroform 4 x 4's and Ace wrap was applied and can be will be applied in the PACU. Patient tolerated the procedure well. Patient sutures removed before the soft dressing was applied.

## 2016-03-10 NOTE — Interval H&P Note (Signed)
History and Physical Interval Note:  03/10/2016 4:08 PM  Shane Montes  has presented today for surgery, with the diagnosis of Right Pilon Fracture with External Fixation  The various methods of treatment have been discussed with the patient and family. After consideration of risks, benefits and other options for treatment, the patient has consented to  Procedure(s): REMOVAL EXTERNAL FIXATION RIGHT ANKLE, SUTURE REMOVAL (Right) Fiberglas Cast Application Right Leg (Right) as a surgical intervention .  The patient's history has been reviewed, patient examined, no change in status, stable for surgery.  I have reviewed the patient's chart and labs.  Questions were answered to the patient's satisfaction.     Eldred MangesMark C Aiya Keach

## 2016-03-11 ENCOUNTER — Encounter (HOSPITAL_COMMUNITY): Payer: Self-pay | Admitting: Orthopaedic Surgery

## 2016-03-19 ENCOUNTER — Encounter (INDEPENDENT_AMBULATORY_CARE_PROVIDER_SITE_OTHER): Payer: Self-pay | Admitting: Orthopaedic Surgery

## 2016-03-19 ENCOUNTER — Ambulatory Visit (INDEPENDENT_AMBULATORY_CARE_PROVIDER_SITE_OTHER): Payer: Worker's Compensation

## 2016-03-19 ENCOUNTER — Ambulatory Visit (INDEPENDENT_AMBULATORY_CARE_PROVIDER_SITE_OTHER): Payer: Worker's Compensation | Admitting: Orthopaedic Surgery

## 2016-03-19 VITALS — BP 134/79 | HR 111 | Ht 72.0 in | Wt 207.0 lb

## 2016-03-19 DIAGNOSIS — S82871D Displaced pilon fracture of right tibia, subsequent encounter for closed fracture with routine healing: Secondary | ICD-10-CM | POA: Diagnosis not present

## 2016-03-19 NOTE — Progress Notes (Signed)
   Post-Op Visit Note   Patient: Shane Montes           Date of Birth: 07/26/1980           MRN: [id number] Visit Date: 03/19/2016 PCP: No PCP Per Patient   Assessment & Plan:  Chief Complaint:  Chief Complaint  Patient presents with  . Right Ankle - Fracture, Routine Post Op   Visit Diagnoses:  1. Closed displaced pilon fracture of right tibia with routine healing, subsequent encounter     Plan: Patient will remove his cam boot to work on ankle range of motion. I'll recheck him in 4 weeks for repeat x-rays on return. He will remain nonweightbearing until return visit  Follow-Up Instructions: No Follow-up on file.   Orders:  Orders Placed This Encounter  Procedures  . XR Ankle Complete Right   No orders of the defined types were placed in this encounter.  HPI Patient returns after removal of ex-fix on right lower leg and suture removal 03/10/2016. He is 9 days post op. His mother has been doing dressing changes. He is doing well. Down to one pain pill a day. States that the CAM boot has been uncomfortable.   PMFS History: Patient Active Problem List   Diagnosis Date Noted  . Closed displaced pilon fracture of right tibia 02/16/2016   Past Medical History:  Diagnosis Date  . Medical history non-contributory     No family history on file.  Past Surgical History:  Procedure Laterality Date  . EXTERNAL FIXATION LEG Right 02/16/2016   Procedure: EXTERNAL FIXATION RIGHT DISTAL TIBIA FOR REDUCTION AND LENGTH ACQUSITION OF PILON FRACTURE FOR PLATE FIXATAION OF FRACTURE;  Surgeon: Eldred MangesMark C Yates, MD;  Location: [facility] OR;  Service: Orthopedics;  Laterality: Right;  . EXTERNAL FIXATION REMOVAL Right 03/10/2016   Procedure: REMOVAL EXTERNAL FIXATION RIGHT ANKLE, SUTURE REMOVAL;  Surgeon: Eldred MangesMark C Yates, MD;  Location: [facility] OR;  Service: Orthopedics;  Laterality: Right;  . FRACTURE SURGERY    . ORIF ANKLE FRACTURE Right 02/16/2016   DISTAL TIBIA PILON FRACTURE   . ORIF ANKLE  FRACTURE Right 02/16/2016   Procedure: OPEN REDUCTION INTERNAL FIXATION (ORIF) RIGHT DISTAL TIBIA PILON FRACTURE;  Surgeon: Eldred MangesMark C Yates, MD;  Location: [facility] OR;  Service: Orthopedics;  Laterality: Right;   Social History   Occupational History  . Not on file.   Social History Main Topics  . Smoking status: Never Smoker  . Smokeless tobacco: Never Used  . Alcohol use Yes     Comment: occasionally  . Drug use: No  . Sexual activity: Not on file

## 2016-04-16 ENCOUNTER — Encounter (INDEPENDENT_AMBULATORY_CARE_PROVIDER_SITE_OTHER): Payer: Self-pay | Admitting: Orthopaedic Surgery

## 2016-04-16 ENCOUNTER — Ambulatory Visit (INDEPENDENT_AMBULATORY_CARE_PROVIDER_SITE_OTHER): Payer: Self-pay

## 2016-04-16 ENCOUNTER — Ambulatory Visit (INDEPENDENT_AMBULATORY_CARE_PROVIDER_SITE_OTHER): Payer: Worker's Compensation | Admitting: Orthopaedic Surgery

## 2016-04-16 VITALS — BP 133/77 | HR 100 | Ht 72.0 in | Wt 207.0 lb

## 2016-04-16 DIAGNOSIS — S82871D Displaced pilon fracture of right tibia, subsequent encounter for closed fracture with routine healing: Secondary | ICD-10-CM

## 2016-04-16 NOTE — Progress Notes (Signed)
   Post-Op Visit Note   Patient: Shane Montes           Date of Birth: 10/13/1980           MRN: [id number] Visit Date: 04/16/2016 PCP: No PCP Per Patient   Assessment & Plan:  Chief Complaint:  Chief Complaint  Patient presents with  . Right Ankle - Routine Post Op   Visit Diagnoses:  1. Closed displaced pilon fracture of right tibia with routine healing     Plan: I will recheck him again in 4 weeks. He can progress his boot into an issue. Or so given for work resumption on 04/19/2016 of light duty work which is sitdown work.  Follow-Up Instructions: No Follow-up on file.   Orders:  Orders Placed This Encounter  Procedures  . XR Ankle Complete Right   No orders of the defined types were placed in this encounter.  HPI Patient returns for four week follow up right ankle. He is status post ORIF Right distal tibia pilon fracture on 02/16/2016 and removal of ex-fix right lower leg and suture removal on 03/10/2016.  He is nonweightbearing in the CAM boot. He states that he is doing well. He is removing the CAM boot and working on ankle ROM. He has a little pain posterior ankle, he thinks , due to stretching.   Imaging: No results found.  PMFS History: Patient Active Problem List   Diagnosis Date Noted  . Closed displaced pilon fracture of right tibia with routine healing 02/16/2016   Past Medical History:  Diagnosis Date  . Medical history non-contributory     No family history on file.  Past Surgical History:  Procedure Laterality Date  . EXTERNAL FIXATION LEG Right 02/16/2016   Procedure: EXTERNAL FIXATION RIGHT DISTAL TIBIA FOR REDUCTION AND LENGTH ACQUSITION OF PILON FRACTURE FOR PLATE FIXATAION OF FRACTURE;  Surgeon: Eldred MangesMark C Amiri Tritch, MD;  Location: [facility] OR;  Service: Orthopedics;  Laterality: Right;  . EXTERNAL FIXATION REMOVAL Right 03/10/2016   Procedure: REMOVAL EXTERNAL FIXATION RIGHT ANKLE, SUTURE REMOVAL;  Surgeon: Eldred MangesMark C Liberato Stansbery, MD;  Location: [facility];  Service:  Orthopedics;  Laterality: Right;  . FRACTURE SURGERY    . ORIF ANKLE FRACTURE Right 02/16/2016   DISTAL TIBIA PILON FRACTURE   . ORIF ANKLE FRACTURE Right 02/16/2016   Procedure: OPEN REDUCTION INTERNAL FIXATION (ORIF) RIGHT DISTAL TIBIA PILON FRACTURE;  Surgeon: Eldred MangesMark C Mel Tadros, MD;  Location: [facility];  Service: Orthopedics;  Laterality: Right;   Social History   Occupational History  . Not on file.   Social History Main Topics  . Smoking status: Never Smoker  . Smokeless tobacco: Never Used  . Alcohol use Yes     Comment: occasionally  . Drug use: No  . Sexual activity: Not on file

## 2016-05-14 ENCOUNTER — Encounter (INDEPENDENT_AMBULATORY_CARE_PROVIDER_SITE_OTHER): Payer: Self-pay | Admitting: Orthopaedic Surgery

## 2016-05-14 ENCOUNTER — Ambulatory Visit (INDEPENDENT_AMBULATORY_CARE_PROVIDER_SITE_OTHER): Payer: Self-pay

## 2016-05-14 ENCOUNTER — Ambulatory Visit (INDEPENDENT_AMBULATORY_CARE_PROVIDER_SITE_OTHER): Payer: Worker's Compensation | Admitting: Orthopaedic Surgery

## 2016-05-14 VITALS — BP 131/76 | HR 92 | Ht 72.0 in | Wt 207.0 lb

## 2016-05-14 DIAGNOSIS — M25571 Pain in right ankle and joints of right foot: Secondary | ICD-10-CM | POA: Diagnosis not present

## 2016-05-14 DIAGNOSIS — S82871D Displaced pilon fracture of right tibia, subsequent encounter for closed fracture with routine healing: Secondary | ICD-10-CM

## 2016-05-14 NOTE — Progress Notes (Signed)
Office Visit Note   Patient: Shane Montes           Date of Birth: September 20, 1980           MRN: [id number] Visit Date: 05/14/2016              Requested by: No referring provider defined for this encounter. PCP: No PCP Per Patient   Assessment & Plan: Visit Diagnoses:  1. Pain in right ankle and joints of right foot   2. Closed displaced pilon fracture of right tibia with routine healing     Plan: Patient has satisfactory radiographic interval healing. He can progress with weightbearing and continue to work on dorsiflexion. He can stand on the step with his heels drop-down. Recheck 1 month. He is already back at work.  Follow-Up Instructions: Return in about 1 month (around 06/14/2016).   Orders:  Orders Placed This Encounter  Procedures  . XR Ankle Complete Right   No orders of the defined types were placed in this encounter.     Procedures: No procedures performed   Clinical Data: No additional findings.   Subjective: Chief Complaint  Patient presents with  . Right Ankle - Follow-up    4 week follow up visit of right ankle.  Patient states overall he is doing well.  He is having numbness on the medial portion or right foot and plantar portion of right heel.  Patient complains of sharp pain on posterior ankle with flexion and extension of foot.  Patient states he was been weight bearing 4 weeks and he has not been wearing boot for 3 weeks. Patient is not taking any medications.    Review of Systems 14 point review of systems updated and is unchanged. He is off all medications for his fracture. He is ambulatory and a normal shoe.   Objective: Vital Signs: BP 131/76   Pulse 92   Ht 6' (1.829 m)   Wt 207 lb (93.9 kg)   BMI 28.07 kg/m   Physical Exam  Constitutional: He is oriented to person, place, and time. He appears well-developed and well-nourished.  HENT:  Head: Normocephalic and atraumatic.  Eyes: EOM are normal. Pupils are equal, round, and  reactive to light.  Neck: No tracheal deviation present. No thyromegaly present.  Cardiovascular: Normal rate.   Pulmonary/Chest: Effort normal. He has no wheezes.  Abdominal: Soft. Bowel sounds are normal.  Musculoskeletal:  Incision is well-healed anterolateral and also medial malleolus. Distal pulses are intact. He has slight numbness along the medial  heel. Superficial peroneal and deep peroneal sensation testing is normal. He has 50% flexion-extension and has progressed with more motion since last month.  Neurological: He is alert and oriented to person, place, and time.  Skin: Skin is warm and dry. Capillary refill takes less than 2 seconds.  Psychiatric: He has a normal mood and affect. His behavior is normal. Judgment and thought content normal.    Ortho Exam  Specialty Comments:  No specialty comments available.  Imaging: No results found.   PMFS History: Patient Active Problem List   Diagnosis Date Noted  . Closed displaced pilon fracture of right tibia with routine healing 02/16/2016   Past Medical History:  Diagnosis Date  . Medical history non-contributory     No family history on file.  Past Surgical History:  Procedure Laterality Date  . EXTERNAL FIXATION LEG Right 02/16/2016   Procedure: EXTERNAL FIXATION RIGHT DISTAL TIBIA FOR REDUCTION AND LENGTH ACQUSITION OF PILON FRACTURE FOR  PLATE FIXATAION OF FRACTURE;  Surgeon: Eldred MangesMark C Salisa Broz, MD;  [facility];  Service: Orthopedics;  Laterality: Right;  . EXTERNAL FIXATION REMOVAL Right 03/10/2016   Procedure: REMOVAL EXTERNAL FIXATION RIGHT ANKLE, SUTURE REMOVAL;  Surgeon: Eldred MangesMark C Madisin Hasan, MD;  Location: [facility] OR;  Service: Orthopedics;  Laterality: Right;  . FRACTURE SURGERY    . ORIF ANKLE FRACTURE Right 02/16/2016   DISTAL TIBIA PILON FRACTURE   . ORIF ANKLE FRACTURE Right 02/16/2016   Procedure: OPEN REDUCTION INTERNAL FIXATION (ORIF) RIGHT DISTAL TIBIA PILON FRACTURE;  Surgeon: Eldred MangesMark C Londyn Hotard, MD;  Location: [facility] OR;   Service: Orthopedics;  Laterality: Right;   Social History   Occupational History  . Not on file.   Social History Main Topics  . Smoking status: Never Smoker  . Smokeless tobacco: Never Used  . Alcohol use Yes     Comment: occasionally  . Drug use: No  . Sexual activity: Not on file

## 2016-06-16 ENCOUNTER — Telehealth (INDEPENDENT_AMBULATORY_CARE_PROVIDER_SITE_OTHER): Payer: Self-pay | Admitting: *Deleted

## 2016-06-16 NOTE — Telephone Encounter (Signed)
ucall. Give him work note that says OK for regular work. Check with him first then print him note thanks. My last note said he was already back at work.

## 2016-06-16 NOTE — Telephone Encounter (Signed)
Patient called in this afternoon in regards to possibly getting a new work status if possible? He will not be coming in to see Dr. Ophelia Charter for another month and he needs to be able to go back to work without restrictions if possible? His CB # [phone]. Thank you

## 2016-06-16 NOTE — Telephone Encounter (Signed)
Please advise 

## 2016-06-17 NOTE — Telephone Encounter (Signed)
I spoke with patient. He needs note stating that he is cleared for regular work with no restrictions. He would like this to be emailed to [email or web address].

## 2016-06-18 ENCOUNTER — Ambulatory Visit (INDEPENDENT_AMBULATORY_CARE_PROVIDER_SITE_OTHER): Payer: Self-pay | Admitting: Orthopaedic Surgery

## 2016-06-18 NOTE — Telephone Encounter (Signed)
Emailed per patient request

## 2016-07-20 ENCOUNTER — Ambulatory Visit (INDEPENDENT_AMBULATORY_CARE_PROVIDER_SITE_OTHER): Payer: Worker's Compensation | Admitting: Orthopaedic Surgery

## 2016-07-20 ENCOUNTER — Encounter (INDEPENDENT_AMBULATORY_CARE_PROVIDER_SITE_OTHER): Payer: Self-pay | Admitting: Orthopaedic Surgery

## 2016-07-20 ENCOUNTER — Ambulatory Visit (INDEPENDENT_AMBULATORY_CARE_PROVIDER_SITE_OTHER): Payer: Worker's Compensation

## 2016-07-20 VITALS — BP 118/83 | HR 93 | Ht 72.0 in | Wt 210.0 lb

## 2016-07-20 DIAGNOSIS — S82871D Displaced pilon fracture of right tibia, subsequent encounter for closed fracture with routine healing: Secondary | ICD-10-CM

## 2016-07-20 NOTE — Progress Notes (Signed)
Office Visit Note   Patient: Shane Montes           Date of Birth: [birth year]           MRN: [id number] Visit Date: 07/20/2016              Requested by: No referring provider defined for this encounter. PCP: Patient, No Pcp Per   Assessment & Plan: Visit Diagnoses:  1. Closed displaced pilon fracture of right tibia with routine healing   Status post work-related injury.  Plan: Patient will continue working on stretching of his ankle. We did discuss the risk of posttraumatic ankle degenerative changes in the future. Will return in 4 weeks for recheck. If he continues to have soreness in the ankle joint itself we did discuss possible intra-articular ankle Marcaine/Depo-Medrol injection.  All questions answered.  Follow-Up Instructions: Return in about 4 weeks (around 08/17/2016).   Orders:  Orders Placed This Encounter  Procedures  . XR Ankle Complete Right   No orders of the defined types were placed in this encounter.     Procedures: No procedures performed   Clinical Data: No additional findings.   Subjective: Chief Complaint  Patient presents with  . Right Ankle - Follow-up    HPI Patient returns. Status post ORIF right distal pilon fracture with application of external fixator 02/16/2016 and also status post external fixator removal 03/10/2016. Right ankle fracture was result of work-related injury falling off a ladder. 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 He has continued to have some soreness throughout his ankle along with swelling during the day. He has not gained full motion of his ankle but continues to do home exercises with stretching.  A few weeks ago patient had a custom orthotic made for his right shoe and was advised at that time that he had a slight leg length discrepancy on the right side. Patient states that with the lift in his shoe that this feels better. This was not a problem before his work-related incident. Since his  surgery continues to have some numbness at the dorsal midfoot.   Review of Systems  Constitutional: Positive for activity change.  HENT: Negative.   Respiratory: Negative.   Musculoskeletal: Positive for joint swelling.  Neurological: Negative.   Psychiatric/Behavioral: Negative.      Objective: Vital Signs: BP 118/83   Pulse 93   Ht 6' (1.829 m)   Wt 210 lb (95.3 kg)   BMI 28.48 kg/m   Physical Exam  Constitutional: He is oriented to person, place, and time. He appears well-developed and well-nourished. No distress.  HENT:  Head: Normocephalic and atraumatic.  Eyes: EOM are normal. Pupils are equal, round, and reactive to light.  Pulmonary/Chest: No respiratory distress.  Musculoskeletal:  Right ankle he does have slight swelling. Tender along the distal tibia over his hardware and also at the ankle joint. Dorsiflexes to neutral. Calf nontender. He has slight decreased sensation to light touch dorsal midfoot.  Neurological: He is alert and oriented to person, place, and time.    Ortho Exam  Specialty Comments:  No specialty comments available.  Imaging: Xr Ankle Complete Right  Result Date: 07/20/2016 X-ray right ankle shows that its hardware is intact. Good healing of his fracture. There is some tibiotalar joint space narrowing.    PMFS History: Patient Active Problem List   Diagnosis Date Noted  . Closed displaced pilon fracture of right tibia with routine healing 02/16/2016   Past  Medical History:  Diagnosis Date  . Medical history non-contributory     No family history on file.  Past Surgical History:  Procedure Laterality Date  . EXTERNAL FIXATION LEG Right 02/16/2016   Procedure: EXTERNAL FIXATION RIGHT DISTAL TIBIA FOR REDUCTION AND LENGTH ACQUSITION OF PILON FRACTURE FOR PLATE FIXATAION OF FRACTURE;  Surgeon: Eldred MangesMark C Yates, MD;  Location: [facility] OR;  Service: Orthopedics;  Laterality: Right;  . EXTERNAL FIXATION REMOVAL Right 03/10/2016   Procedure:  REMOVAL EXTERNAL FIXATION RIGHT ANKLE, SUTURE REMOVAL;  Surgeon: Eldred MangesMark C Yates, MD;  Location: [facility] OR;  Service: Orthopedics;  Laterality: Right;  . FRACTURE SURGERY    . ORIF ANKLE FRACTURE Right 02/16/2016   DISTAL TIBIA PILON FRACTURE   . ORIF ANKLE FRACTURE Right 02/16/2016   Procedure: OPEN REDUCTION INTERNAL FIXATION (ORIF) RIGHT DISTAL TIBIA PILON FRACTURE;  Surgeon: Eldred MangesMark C Yates, MD;  Location: [facility];  Service: Orthopedics;  Laterality: Right;   Social History   Occupational History  . Not on file.   Social History Main Topics  . Smoking status: Never Smoker  . Smokeless tobacco: Never Used  . Alcohol use Yes     Comment: occasionally  . Drug use: No  . Sexual activity: Not on file

## 2016-07-30 ENCOUNTER — Telehealth (INDEPENDENT_AMBULATORY_CARE_PROVIDER_SITE_OTHER): Payer: Self-pay

## 2016-07-30 NOTE — Telephone Encounter (Signed)
Faxed the 07/20/16 office note to th wc adj per her request

## 2016-08-17 ENCOUNTER — Ambulatory Visit (INDEPENDENT_AMBULATORY_CARE_PROVIDER_SITE_OTHER): Payer: Worker's Compensation | Admitting: Orthopaedic Surgery

## 2016-08-17 ENCOUNTER — Encounter (INDEPENDENT_AMBULATORY_CARE_PROVIDER_SITE_OTHER): Payer: Self-pay | Admitting: Orthopaedic Surgery

## 2016-08-17 VITALS — BP 114/75 | HR 79 | Ht 72.0 in | Wt 205.0 lb

## 2016-08-17 DIAGNOSIS — S82871D Displaced pilon fracture of right tibia, subsequent encounter for closed fracture with routine healing: Secondary | ICD-10-CM

## 2016-08-17 NOTE — Progress Notes (Signed)
Office Visit Note   Shane Montes: Shane Montes           Date of Birth: 12/04/1980           MRN: [id number] Visit Date: 08/17/2016              Requested by: No referring provider defined for this encounter. PCP: Shane Montes, No Pcp Per   Assessment & Plan: Visit Diagnoses:  1. Displaced pilon fracture of right tibia, subsequent encounter for closed fracture with routine healing     Plan: Shane Montes returns is improved ankle dorsiflexion was stretching exercises. He is Amy tori without a limp. He is continuing to stretching exercises and x-rays demonstrated healing of the distal intra-articular comminuted fracture with plate and screw fixation. Based on the surgery for repair of a joint the lower extremity with potential for development of later arthritis his impairment is 10% of the right foot. Shane Montes is at [employer] and is released for all work activity. Office follow-up when necessary  Follow-Up Instructions: No Follow-up on file.   Orders:  No orders of the defined types were placed in this encounter.  No orders of the defined types were placed in this encounter.     Procedures: No procedures performed   Clinical Data: No additional findings.   Subjective: Chief Complaint  Shane Montes presents with  . Right Ankle - Follow-up    HPI tissue returns to stand stretching exercises and has improved dorsiflexion of the ankle which is almost symmetrical to the opposite ankle he lacks maybe 5 of dorsiflexion. Plantarflexion looks good. Scar is well-healed the same dilatory back doing full duty. Last x-ray showed complete healing of the fracture.  Review of Systems 14 point review of systems is updated from last office visit and is unchanged other than mentioned in history of present illness   Objective: Vital Signs: BP 114/75   Pulse 79   Ht 6' (1.829 m)   Wt 205 lb (93 kg)   BMI 27.80 kg/m   Physical Exam  Constitutional: He is oriented to person, place, and time. He appears  well-developed and well-nourished.  HENT:  Head: Normocephalic and atraumatic.  Eyes: EOM are normal. Pupils are equal, round, and reactive to light.  Neck: No tracheal deviation present. No thyromegaly present.  Cardiovascular: Normal rate.   Pulmonary/Chest: Effort normal. He has no wheezes.  Abdominal: Soft. Bowel sounds are normal.  Musculoskeletal:  Well-healed right ankle incision. Shane Montes has symmetrical ankle plantar flexion. Dorsiflexion is 5 the opposite left ankle. No swelling of the ankle no cellulitis distal pulses are intact. He has slight numbness over the dorsum of his foot which is related to the fracture and surgical approach. Plantar foot sensation is intact.  Neurological: He is alert and oriented to person, place, and time.  Skin: Skin is warm and dry. Capillary refill takes less than 2 seconds.  Psychiatric: He has a normal mood and affect. His behavior is normal. Judgment and thought content normal.    Ortho Exam  Specialty Comments:  No specialty comments available.  Imaging: No results found.   PMFS History: There are no active problems to display for this Shane Montes.  Past Medical History:  Diagnosis Date  . Medical history non-contributory     No family history on file.  Past Surgical History:  Procedure Laterality Date  . EXTERNAL FIXATION LEG Right 02/16/2016   Procedure: EXTERNAL FIXATION RIGHT DISTAL TIBIA FOR REDUCTION AND LENGTH ACQUSITION OF PILON FRACTURE FOR PLATE FIXATAION OF FRACTURE;  Surgeon: Eldred Manges, MD;  Location: [facility];  Service: Orthopedics;  Laterality: Right;  . EXTERNAL FIXATION REMOVAL Right 03/10/2016   Procedure: REMOVAL EXTERNAL FIXATION RIGHT ANKLE, SUTURE REMOVAL;  Surgeon: Eldred Manges, MD;  Location: [facility] OR;  Service: Orthopedics;  Laterality: Right;  . FRACTURE SURGERY    . ORIF ANKLE FRACTURE Right 02/16/2016   DISTAL TIBIA PILON FRACTURE   . ORIF ANKLE FRACTURE Right 02/16/2016   Procedure: OPEN REDUCTION INTERNAL  FIXATION (ORIF) RIGHT DISTAL TIBIA PILON FRACTURE;  Surgeon: Eldred Manges, MD;  Location: [facility] OR;  Service: Orthopedics;  Laterality: Right;   Social History   Occupational History  . Not on file.   Social History Main Topics  . Smoking status: Never Smoker  . Smokeless tobacco: Never Used  . Alcohol use Yes     Comment: occasionally  . Drug use: No  . Sexual activity: Not on file

## 2018-02-17 IMAGING — CT CT ANKLE*R* W/O CM
3 series · 12 of 33 positions shown, 14 images · non-contrast
Comparison: Right ankle radiographs earlier today

CLINICAL DATA: Ankle injury. Right ankle pain. Fell off ladder
today. Fractures on radiographs. Initial encounter.

EXAM:
CT OF THE RIGHT ANKLE WITHOUT CONTRAST
TECHNIQUE: Multidetector CT imaging of the right ankle was performed according
to the standard protocol. Multiplanar CT image reconstructions were
also generated.

[Series 8: axial st · axial · 0.29mm/px · z∈[-299,-173]mm · 4 of 184 slices shown, 5 images]
[im 29/184  soft-tissue]
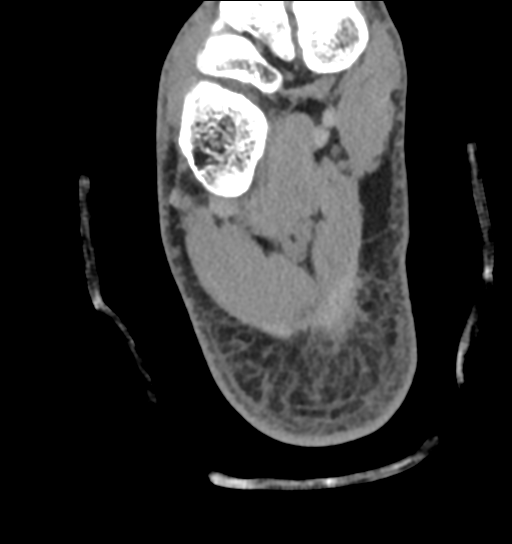
[im 29/184  bone]
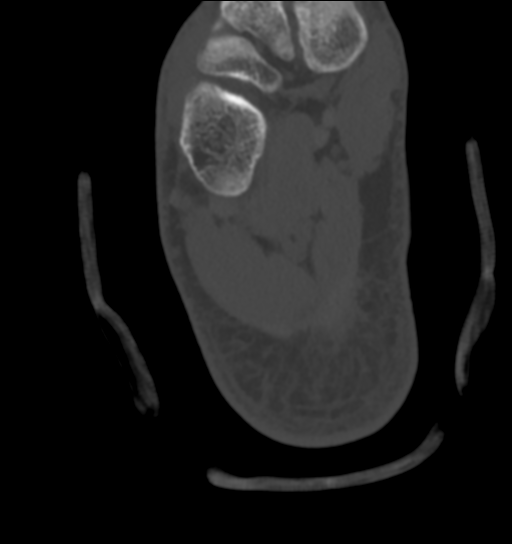
[im 71/184  bone]
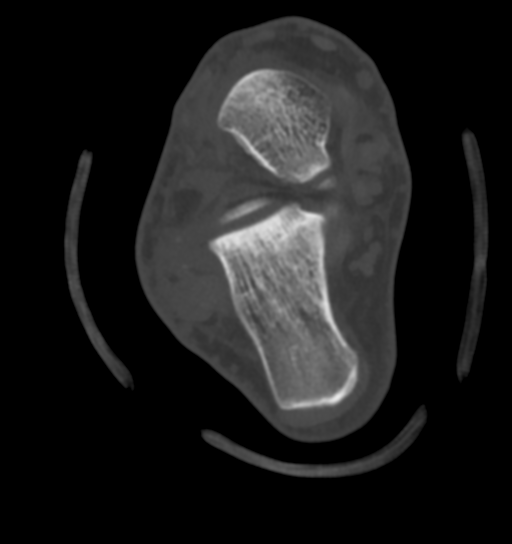
[im 113/184  bone]
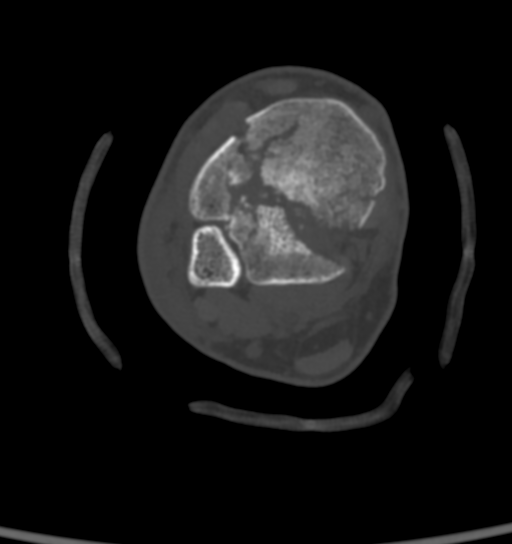
[im 155/184  bone]
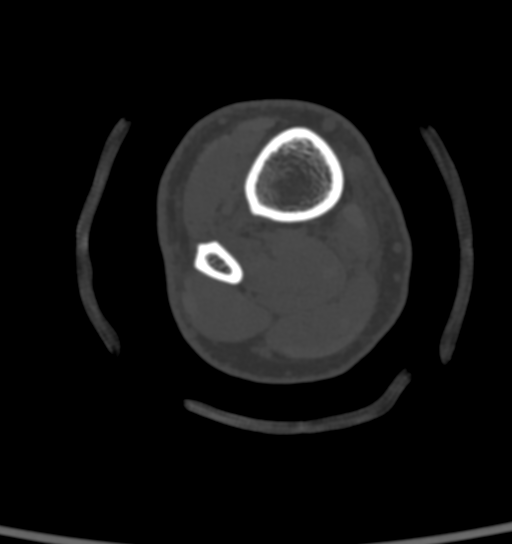

[Series 9: cor st · coronal · 0.24mm/px · 3 of 155 slices shown]
[im 31/155  bone]
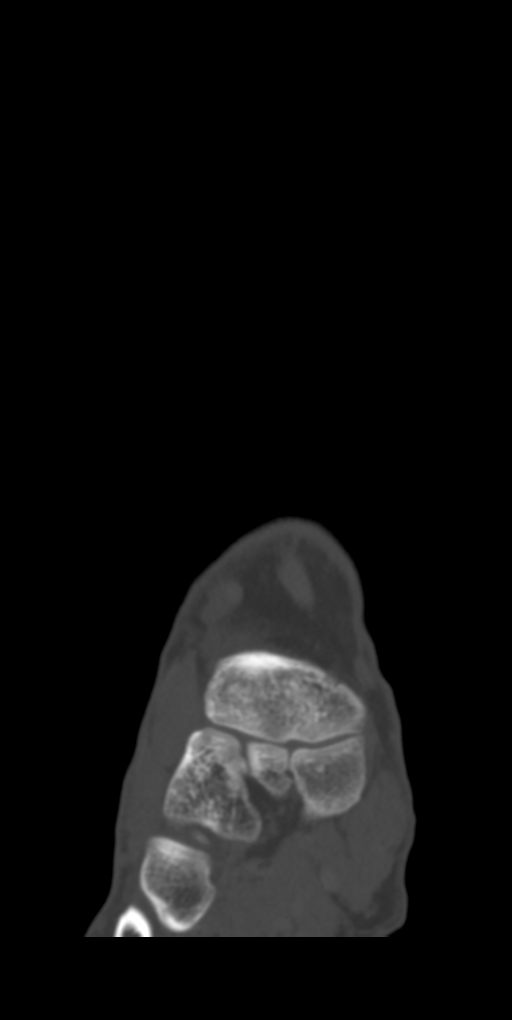
[im 62/155  bone]
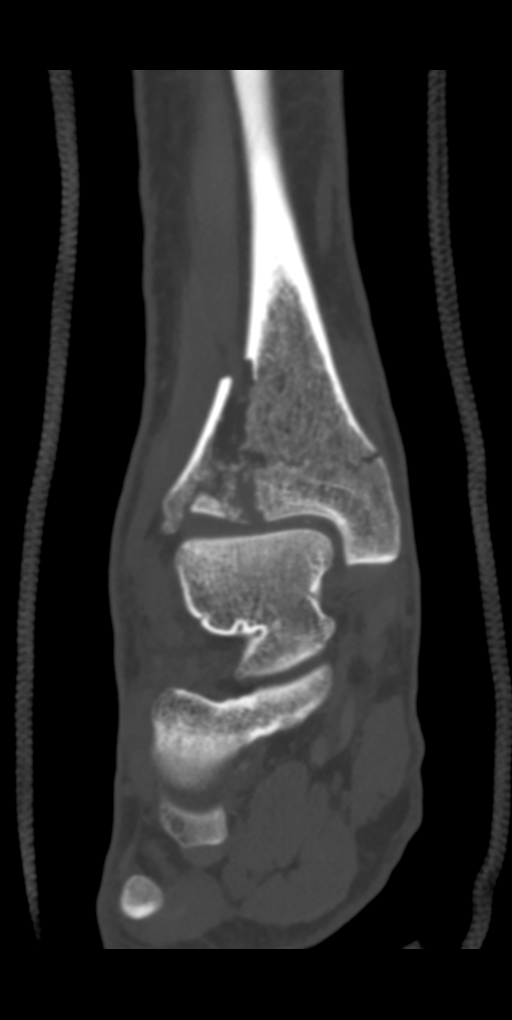
[im 93/155  bone]
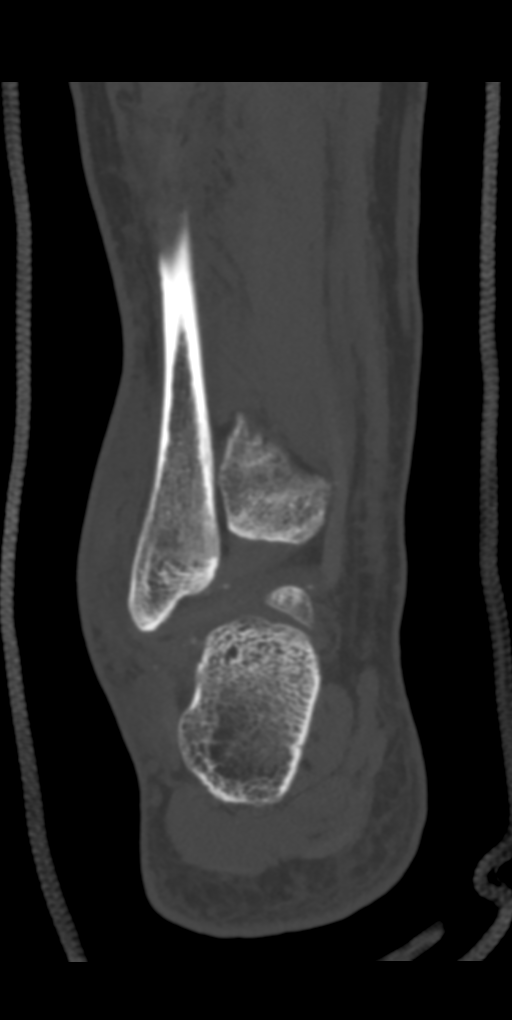

[Series 10: sag st · sagittal · 0.36mm/px · 5 of 108 slices shown, 6 images]
[im 36/108  bone]
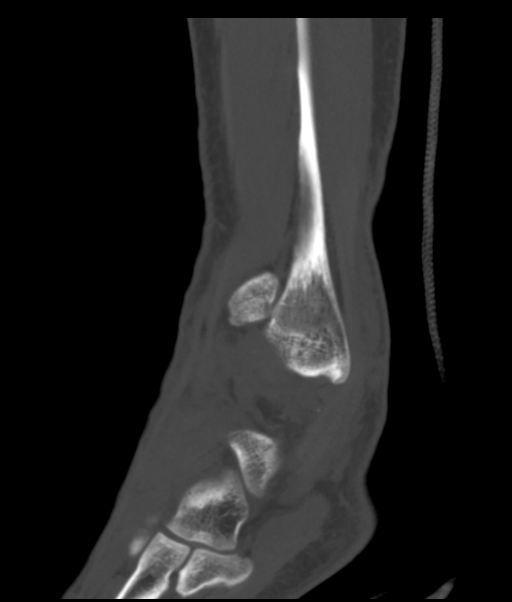
[im 45/108  bone]
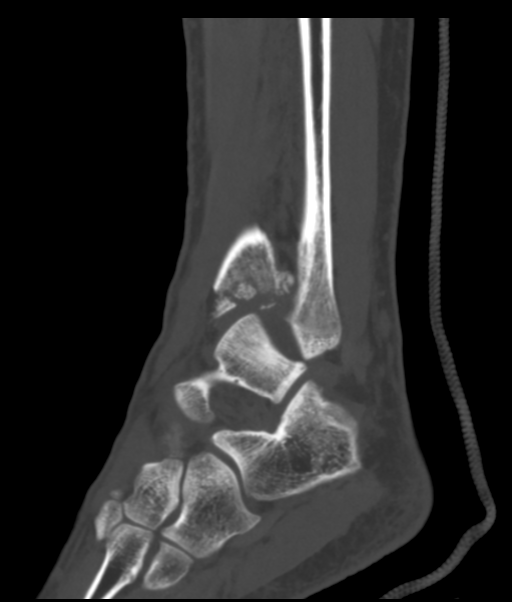
[im 54/108  soft-tissue]
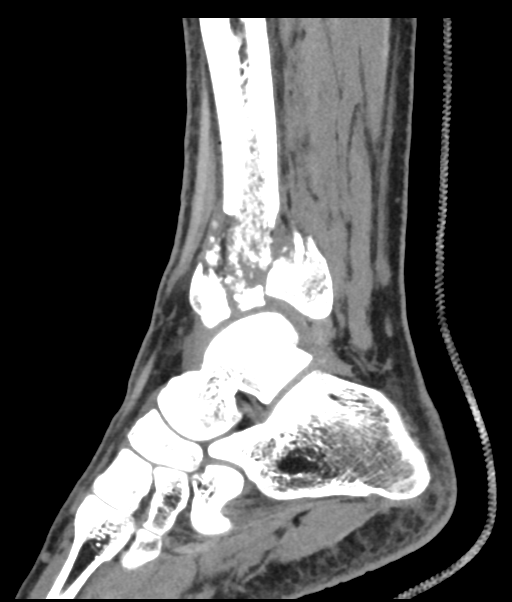
[im 54/108  bone]
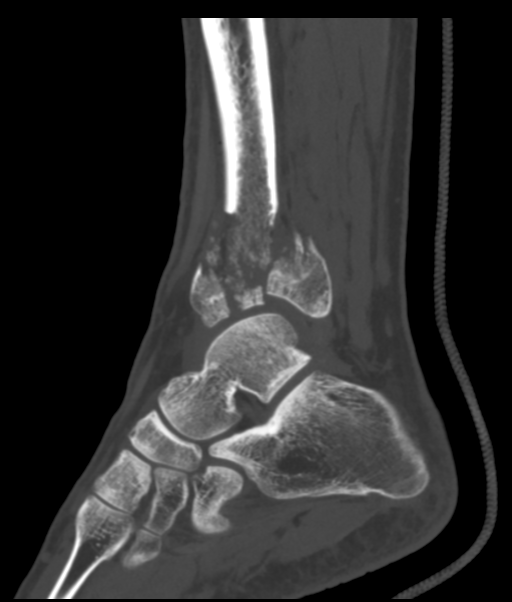
[im 63/108  bone]
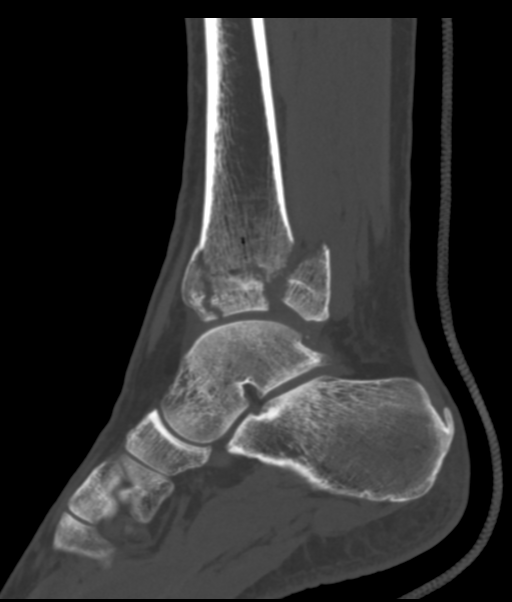
[im 72/108  bone]
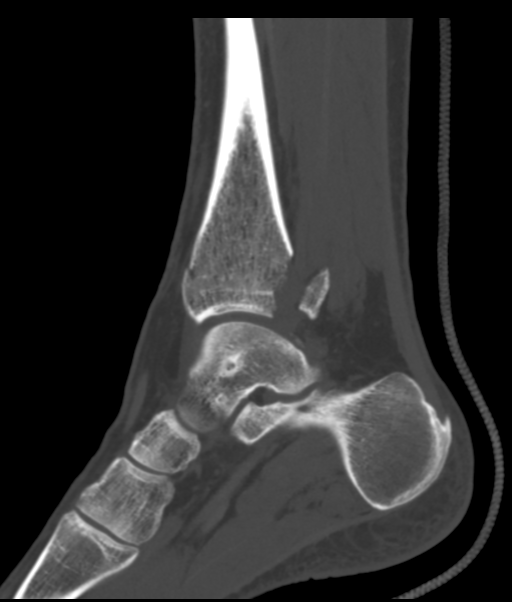

[12 of 33 positions shown; findings below may reference images not displayed]

FINDINGS: As described on earlier radiographs, there is a highly comminuted
intra-articular fracture of the distal tibia. The posterior
malleolus fragment is displaced 1.4 cm posteriorly. There is no
significant displacement of the medial malleolus fracture. There is
an approximately 1.4 cm articular surface defect involving the
lateral aspect of the tibial plafond. Punctate osseous fragments are
present within the ankle joint, predominantly posteriorly. There is
no dislocation. A tiny avulsion fracture is again noted of the tip
of the lateral malleolus.

The talus, calcaneus, bones of the midfoot, and visualized proximal
portions of the metatarsals appear intact. There is mild spurring at
the Achilles insertion. An ankle joint effusion is noted, and there
is soft tissue swelling and hematoma involving the lateral greater
than medial aspects of the ankle and hindfoot.
IMPRESSION: 1. Comminuted, intra-articular fracture of the distal tibia as
above.
2. Tiny avulsion fracture of the lateral malleolus.

## 2018-02-17 IMAGING — CR DG ANKLE COMPLETE 3+V*R*
3 series · 3 of 3 positions shown · non-contrast
Comparison: None.

CLINICAL DATA: Fall.

EXAM:
RIGHT ANKLE - COMPLETE 3+ VIEW

[t ankle joint ap right]
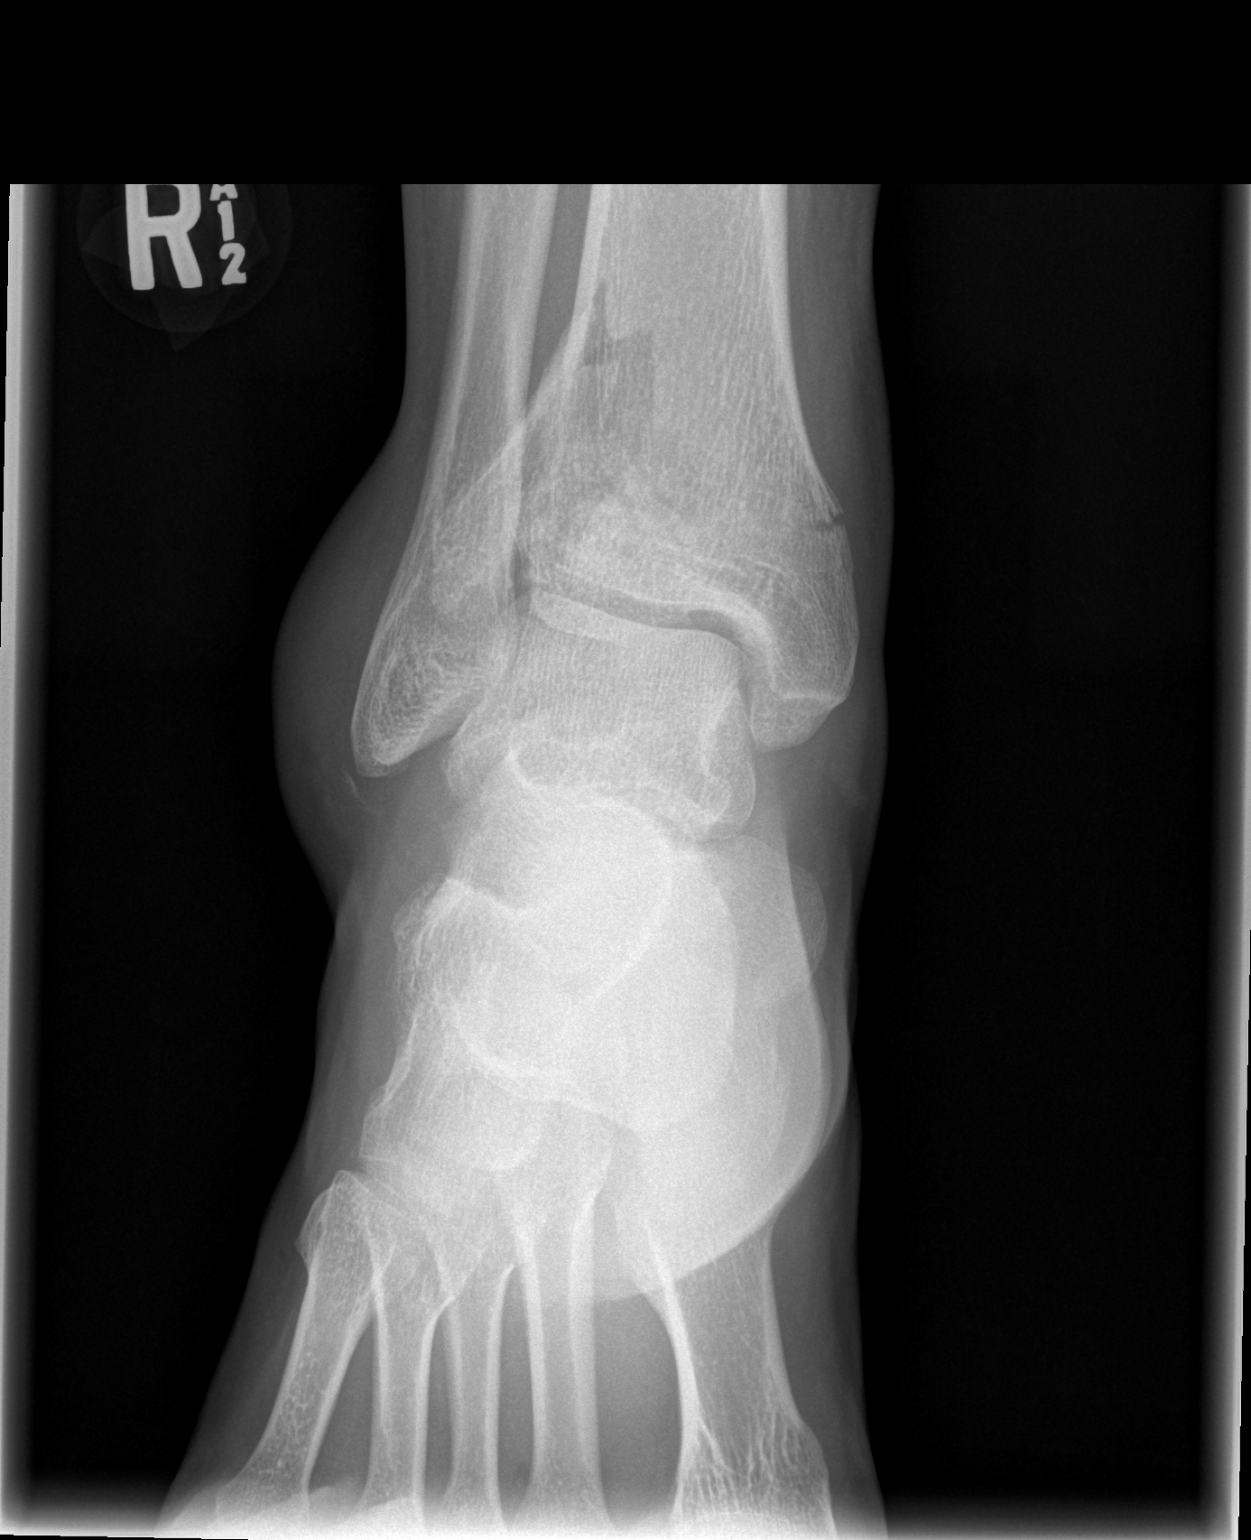

[t ankle joint oblique right]
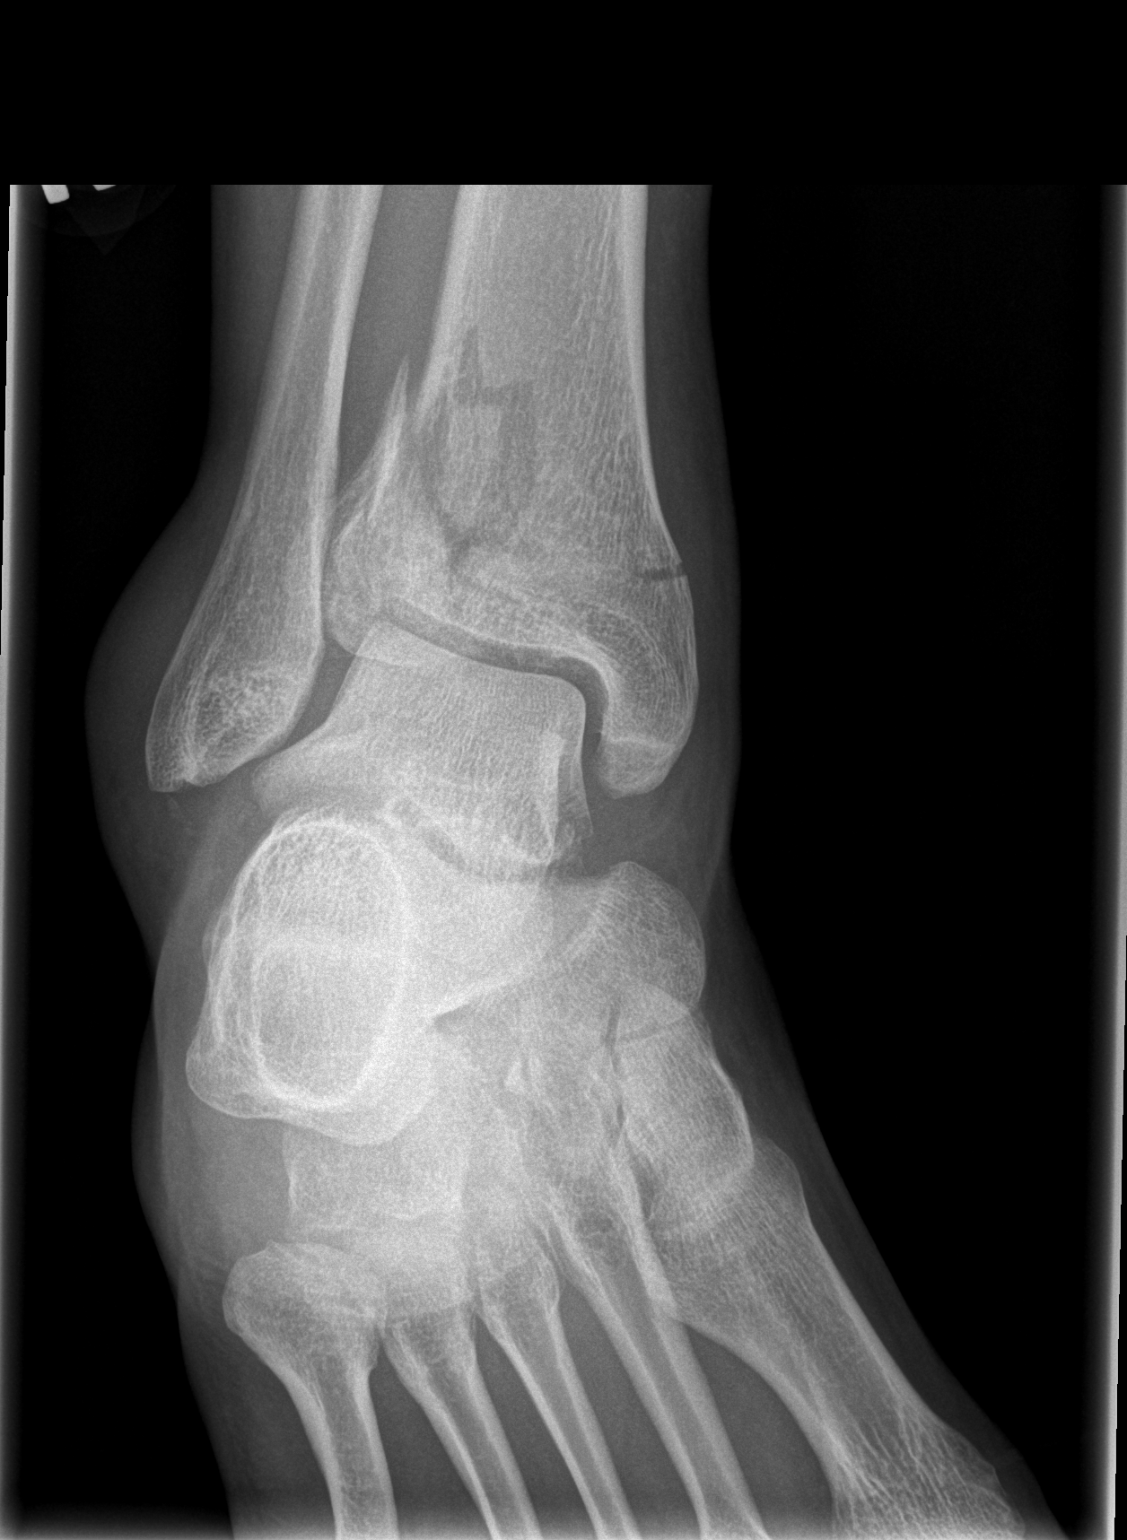

[t ankle joint lat right]
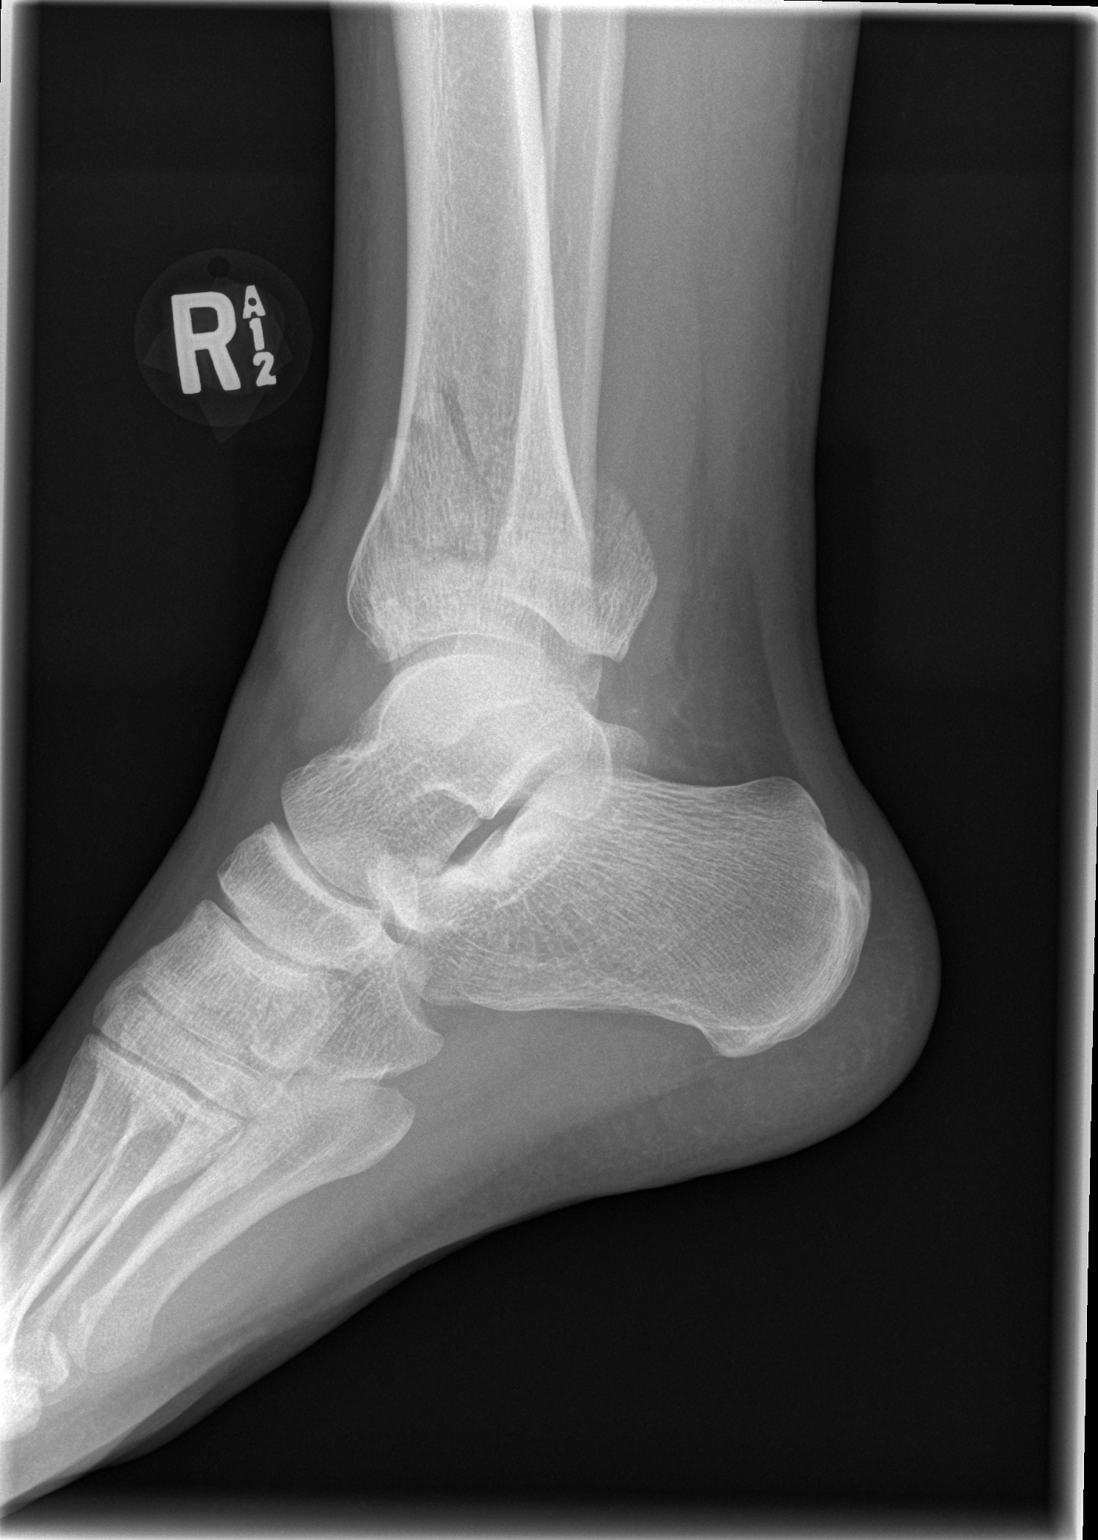

[3 of 3 positions shown; findings below may reference images not displayed]

FINDINGS: There is a comminuted, intra-articular fracture deformity involving
the distal tibia. A minimally displaced medial malleolar fracture is
identified. Moderately displaced posterior malleolar fracture as
well as a fracture involving the medial aspect of the distal tibia
which extends into the tibiotalar joint. Diffuse soft tissue
swelling identified. A small avulsion fracture arises from the
distal tip of the lateral malleolus.
IMPRESSION: 1. Comminuted fracture deformity involves the distal tibia including
the medial malleolus, posterior malleolus and lateral articular
surface of the tibiotalar joint. There is also an avulsion fracture
arising from the tip of the lateral malleolus.
